# Patient Record
Sex: Female | Born: 1980 | Race: White | Hispanic: No | Marital: Married
Health system: Midwestern US, Community
[De-identification: ages and names within clinical notes are randomized; demographics above are authoritative.]

## PROBLEM LIST (undated history)

## (undated) DIAGNOSIS — C4491 Basal cell carcinoma of skin, unspecified: Secondary | ICD-10-CM

## (undated) DIAGNOSIS — F419 Anxiety disorder, unspecified: Secondary | ICD-10-CM

## (undated) DIAGNOSIS — K644 Residual hemorrhoidal skin tags: Secondary | ICD-10-CM

## (undated) DIAGNOSIS — T7840XA Allergy, unspecified, initial encounter: Secondary | ICD-10-CM

## (undated) DIAGNOSIS — R928 Other abnormal and inconclusive findings on diagnostic imaging of breast: Secondary | ICD-10-CM

## (undated) DIAGNOSIS — Z1231 Encounter for screening mammogram for malignant neoplasm of breast: Secondary | ICD-10-CM

## (undated) DIAGNOSIS — Z0000 Encounter for general adult medical examination without abnormal findings: Secondary | ICD-10-CM

## (undated) DIAGNOSIS — Z8619 Personal history of other infectious and parasitic diseases: Secondary | ICD-10-CM

## (undated) DIAGNOSIS — R921 Mammographic calcification found on diagnostic imaging of breast: Secondary | ICD-10-CM

## (undated) DIAGNOSIS — N6310 Unspecified lump in the right breast, unspecified quadrant: Secondary | ICD-10-CM

## (undated) DIAGNOSIS — M25851 Other specified joint disorders, right hip: Secondary | ICD-10-CM

## (undated) DIAGNOSIS — R109 Unspecified abdominal pain: Secondary | ICD-10-CM

## (undated) HISTORY — DX: Basal cell carcinoma of skin, unspecified: C44.91

## (undated) HISTORY — DX: Residual hemorrhoidal skin tags: K64.4

## (undated) HISTORY — DX: Allergy, unspecified, initial encounter: T78.40XA

---

## 2008-04-19 HISTORY — PX: OTHER SURGICAL HISTORY: SHX169

## 2008-07-29 ENCOUNTER — Ambulatory Visit: Payer: Self-pay | Admitting: Internal Medicine

## 2008-07-29 DIAGNOSIS — G43909 Migraine, unspecified, not intractable, without status migrainosus: Secondary | ICD-10-CM | POA: Insufficient documentation

## 2008-07-29 DIAGNOSIS — K602 Anal fissure, unspecified: Secondary | ICD-10-CM | POA: Insufficient documentation

## 2010-06-18 LAB — HM PAP SMEAR

## 2011-03-15 ENCOUNTER — Encounter: Payer: Self-pay | Admitting: Internal Medicine

## 2011-03-15 ENCOUNTER — Ambulatory Visit (INDEPENDENT_AMBULATORY_CARE_PROVIDER_SITE_OTHER): Payer: BC Managed Care – PPO | Admitting: Internal Medicine

## 2011-03-15 VITALS — BP 110/70 | HR 60 | Ht 62.0 in | Wt 110.0 lb

## 2011-03-15 DIAGNOSIS — R002 Palpitations: Secondary | ICD-10-CM

## 2011-03-15 DIAGNOSIS — J309 Allergic rhinitis, unspecified: Secondary | ICD-10-CM | POA: Insufficient documentation

## 2011-03-15 DIAGNOSIS — F411 Generalized anxiety disorder: Secondary | ICD-10-CM

## 2011-03-15 DIAGNOSIS — J069 Acute upper respiratory infection, unspecified: Secondary | ICD-10-CM | POA: Insufficient documentation

## 2011-03-15 DIAGNOSIS — F419 Anxiety disorder, unspecified: Secondary | ICD-10-CM | POA: Insufficient documentation

## 2011-03-15 LAB — CBC WITH DIFFERENTIAL/PLATELET
Basophils Relative: 0.5 % (ref 0.0–3.0)
Eosinophils Relative: 4.2 % (ref 0.0–5.0)
HCT: 40.9 % (ref 36.0–46.0)
MCV: 96.5 fl (ref 78.0–100.0)
Monocytes Absolute: 0.5 10*3/uL (ref 0.1–1.0)
Neutrophils Relative %: 77.6 % — ABNORMAL HIGH (ref 43.0–77.0)
RBC: 4.24 Mil/uL (ref 3.87–5.11)
WBC: 7.8 10*3/uL (ref 4.5–10.5)

## 2011-03-15 LAB — T4, FREE: Free T4: 0.95 ng/dL (ref 0.60–1.60)

## 2011-03-15 LAB — TSH: TSH: 2.11 u[IU]/mL (ref 0.35–5.50)

## 2011-03-15 LAB — BASIC METABOLIC PANEL
Chloride: 106 mEq/L (ref 96–112)
Creatinine, Ser: 0.6 mg/dL (ref 0.4–1.2)
Potassium: 4.4 mEq/L (ref 3.5–5.1)
Sodium: 142 mEq/L (ref 135–145)

## 2011-03-15 LAB — LIPID PANEL
Total CHOL/HDL Ratio: 2
Triglycerides: 81 mg/dL (ref 0.0–149.0)

## 2011-03-15 LAB — HEPATIC FUNCTION PANEL
ALT: 16 U/L (ref 0–35)
Alkaline Phosphatase: 49 U/L (ref 39–117)
Bilirubin, Direct: 0.1 mg/dL (ref 0.0–0.3)
Total Protein: 7.4 g/dL (ref 6.0–8.3)

## 2011-03-15 MED ORDER — ALPRAZOLAM 0.25 MG PO TABS: ORAL_TABLET | ORAL | Status: DC

## 2011-03-15 NOTE — Progress Notes (Signed)
Subjective:    Patient ID: [name] [name], female    DOB: 21-Aug-1980, 30 y.o.   MRN: [id number]  HPI Patient comes in as new patient visit . Previous care was  With her specialists: Sees OB [facility] DR fogelman and Dr Barnetta Chapel allergy.Has dx of  Allergies  with some asthmatic component  Given inhalers and no difference in her SOB  .    ? If anxiety after breathing tests done.  Per  Dr Barnetta Chapel.  Has had  Evaluation  at urgent care .   Nasal steroid flonase  Feels like going to get a cold.   Prev on  allergy shots.    PF meter  Noted  No change .   Given xanax as needed over a year ago and has same bottle.    Urgent care [address]   ? Name  Of such.  Things that many or her sx could be anxiety .  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  No panic in over a year .  Daily  Anxiety   Tends to worry a lot and builds up.    Tends to be a perfectionist.   From [place]  In [place] for 8 years  Some family here .  Review of Systems ROS:  GEN/ HEENTNo fever, significant weight changes sweats headaches vision problems hearing changes, CV/ PULM; No chest pain cough, syncope,edema  change in exercise tolerance. ocass  skippd beat or turning over.  Palpitation.  GI /GU: No adominal pain, vomiting, change in bowel habits. No blood in the stool. No significant GU symptoms. Periods are normal   No ocps  Off for 5 months. Trying for conceptions SKIN/HEME: ,no acute skin rashes suspicious lesions or bleeding. No lymphadenopathy, nodules, masses.  NEURO/ PSYCH:  No neurologic signs such as weakness numbness No depression IMM/ Allergy: No unusual infections.  Allergy .   REST of 12 system review negative  No active migraines   Past Medical History  Diagnosis Date  . Allergy   . Migraine   . Skin tag of anus     bleeding with them  . Skin cancer, basal cell     lower left back    History   Social History  . Marital Status: Married    Spouse Name: N/A    Number of Children: N/A  . Years of  Education: N/A   Occupational History  . Not on file.   Social History Main Topics  . Smoking status: Never Smoker   . Smokeless tobacco: Not on file  . Alcohol Use: Yes  . Drug Use: No  . Sexually Active: Not on file   Other Topics Concern  . 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 Basal cell skin cancer removed 2010    lower left back    Family History  Problem Relation Age of Onset  . Hypertension Mother   . Diabetes Mother   . Heart disease Father     suddent death ?  about age 44   . Healthy Sister   . Hypertension Brother   . Healthy Sister   . Alcohol abuse      MGF  . Breast cancer  GPs  . Lung cancer      gp     Allergies  Allergen Reactions  . Moxifloxacin     REACTION: difficulty breathing    No current outpatient prescriptions on file prior to visit.    BP 110/70  Pulse 60  Ht 5\' 2"  (1.575 m)  Wt 110 lb (49.896 kg)  BMI 20.12 kg/m2  LMP 03/13/2011        Objective:   Physical Exam WDWN in NAD  quiet respirations; mildly congested  somewhat hoarse. Non toxic . HEENT: Normocephalic ;atraumatic , Eyes;  PERRL, EOMs  Full, lids and conjunctiva clear,,Ears: no deformities, canals nl, TM landmarks normal, Nose: no deformity or discharge but congested;face minimally tender Mouth : OP clear without lesion or edema . Neck: Supple without adenopathy or masses or bruits Chest:  Clear to A&P without wheezes rales or rhonchi CV:  S1-S2 no gallops or murmurs peripheral perfusion is normal Skin :nl perfusion and no acute rashes  NEURO: oriented x 3 CN 3-12 appear intact. No focal muscle weakness or atrophy. DTRs symmetrical. Gait WNL.  Grossly non focal. No tremor or abnormal movement. Oriented x 3. Normal cognition, attention,  [email or web address] anxious  Good eye contact .       Assessment & Plan:  Allergic rhinitis can be problematic   Problem with given nasal steroid Sob episodes  Poss from anxiety URI /.viral should resolve  Anxiety  ? On hand .  Med with help and little use.   Risk benefit of medication discussed.  Disc etoh intake is counter productive  Sometimes uses 5 bev at a time .  fam hs MGF etoh  Counseled.   At length   Labs to be done to r/o metabolic contributor as this was not yet done . Suspect to be normal follow up depending on results.

## 2011-03-15 NOTE — Patient Instructions (Signed)
Will notify you  of labs when available. Consider changing type of nasal cortisone speak with dr Barnetta Chapel for that. Limit alcohol as we discussed as well as caffeine. Do not take xanax with alcohol.  Disc with OB [facility] if need controller med for anxiety which choices may be best.

## 2011-03-18 ENCOUNTER — Encounter: Payer: Self-pay | Admitting: *Deleted

## 2011-03-18 NOTE — Progress Notes (Signed)
Quick Note:  Mailed letter to pt ______ 

## 2011-04-01 ENCOUNTER — Encounter: Payer: Self-pay | Admitting: Internal Medicine

## 2011-05-05 LAB — OB RESULTS CONSOLE HIV ANTIBODY (ROUTINE TESTING): HIV: NONREACTIVE

## 2011-05-05 LAB — OB RESULTS CONSOLE HEPATITIS B SURFACE ANTIGEN: Hepatitis B Surface Ag: NEGATIVE

## 2011-05-05 LAB — OB RESULTS CONSOLE ABO/RH: RH Type: POSITIVE

## 2011-05-05 LAB — OB RESULTS CONSOLE RUBELLA ANTIBODY, IGM: Rubella: IMMUNE

## 2011-05-27 ENCOUNTER — Other Ambulatory Visit: Payer: Self-pay

## 2011-06-04 LAB — OB RESULTS CONSOLE GC/CHLAMYDIA: Chlamydia: NEGATIVE

## 2011-06-07 ENCOUNTER — Other Ambulatory Visit (HOSPITAL_COMMUNITY): Payer: Self-pay | Admitting: Obstetrics

## 2011-06-07 DIAGNOSIS — Z0489 Encounter for examination and observation for other specified reasons: Secondary | ICD-10-CM

## 2011-06-30 ENCOUNTER — Encounter (HOSPITAL_COMMUNITY): Payer: Self-pay | Admitting: MS"

## 2011-07-16 ENCOUNTER — Ambulatory Visit (HOSPITAL_COMMUNITY)
Admission: RE | Admit: 2011-07-16 | Discharge: 2011-07-16 | Disposition: A | Discharge status: Home / Self Care | Payer: BC Managed Care – PPO | Source: Ambulatory Visit | Attending: Obstetrics | Admitting: Obstetrics

## 2011-07-16 ENCOUNTER — Encounter (HOSPITAL_COMMUNITY): Payer: Self-pay

## 2011-07-16 ENCOUNTER — Other Ambulatory Visit: Payer: Self-pay

## 2011-07-16 DIAGNOSIS — Z0489 Encounter for examination and observation for other specified reasons: Secondary | ICD-10-CM

## 2011-07-16 DIAGNOSIS — O352XX0 Maternal care for (suspected) hereditary disease in fetus, not applicable or unspecified: Secondary | ICD-10-CM | POA: Insufficient documentation

## 2011-07-16 DIAGNOSIS — O358XX0 Maternal care for other (suspected) fetal abnormality and damage, not applicable or unspecified: Secondary | ICD-10-CM | POA: Insufficient documentation

## 2011-07-16 DIAGNOSIS — Z1389 Encounter for screening for other disorder: Secondary | ICD-10-CM | POA: Insufficient documentation

## 2011-07-16 DIAGNOSIS — Z363 Encounter for antenatal screening for malformations: Secondary | ICD-10-CM | POA: Insufficient documentation

## 2011-07-16 HISTORY — DX: Anxiety disorder, unspecified: F41.9

## 2011-07-16 NOTE — Progress Notes (Signed)
Genetic Counseling  High-Risk Gestation Note  Appointment Date:  07/16/2011 Referred By: [name] [name]., MD Date of Birth:  02-13-1981 Partner: [name] [name]    Pregnancy History: G1P0000 Estimated Date of Delivery: 12/15/11 Estimated Gestational Age: [redacted]w[redacted]d Attending: Rica Koyanagi, MD   [name] [name] and her husband, [name] [name], were seen for genetic counseling because of a family history of DiGeorge syndrome. They were also accompanied by the patient's mother to today's visit.   Both family histories were reviewed and found to be contributory for the patient's nephew born with DiGeorge syndrome. He had tetralogy of fallot and passed away at age 64 months. Reportedly his parents (the patient's sister and her husband) had normal testing for DiGeorge syndrome in [place], [place]. We do not currently have medical documentation confirming this information.   DiGeorge syndrome, also called 22q11.2 deletion syndrome or Velocardiofacial syndrome is caused by the deletion of genes on one end of chromosome 22 (the q11.2 region of the chromosome). Common features of this condition include heart defects, cleft palate, characteristic facial features, immune deficiency, and learning disabilities. Less commonly, individuals with DiGeorge syndrome may also have an autoimmune disease, growth hormone deficiency, hearing loss, and psychiatric illness. Symptoms may vary significantly from person to person, even among relatives.    Approximately 93% of individuals with DiGeorge syndrome have a de novo (occuring for the first time in that individual) deletion of the 22q11.2 region, and approximately 7% of individuals inherited the deletion from one parent who also carries the deletion of the 22q11.2 region. DiGeorge syndrome is an autosomal dominant condition, meaning once a person has the condition they have a 50% (1 in 2) chance with each pregnancy to pass on the chromosome 22 that has the  deletion and also have a child with the condition. Targeted ultrasound may detect features of 22q11.2 deletion syndrome, though features are variable, even within families. Ultrasound would not diagnose or rule out this condition. We discussed that prenatal testing for 22q11.2 deletion syndrome could  be done on cultured amniocytes using FISH (Fluorescent in-situ hybridization) to detect a deletion of 22q11.2, as this deletion is not typically detected on routine chromosome analysis. However, given the reported family history and the reported normal testing for [name] [name]'s sister (indicating that her sister does not carry the 22q11.2 deletion), recurrence risk for DiGeorge syndrome in the current pregnancy is not expected to be increased above the general population risk.   Additionally, the patient reported a maternal first cousin once removed with Down syndrome (unspecified type). We discussed that 95% of cases of Down syndrome are not inherited and are the result of non-disjunction.  Three to 4% of cases of Down syndrome are the result of a translocation involving chromosome #21.  We discussed the option of chromosome analysis to determine if an individual is a carrier of a balanced translocation involving chromosome #21.  If an individual carries a balanced translocation involving chromosome #21, then the chance to have a baby with Down syndrome would be greater than the maternal age-related risk.  Given the reported family history, this relative's Down syndrome was most likely sporadic. However, additional information regarding this relative's karyotype, or the karyotype of more closely related relatives may alter recurrence risk assessment.    The father of the pregnancy reported that he has bicuspid aortic valve and supraventricular tachycardia, diagnosed within the past 5 years. He has surgery scheduled for SVT in June, and he reported that he does not require surgical correction of  the bicuspid  aortic valve at this time. Additionally, his brother has Wolf-Parkinson White syndrome. Congenital heart defects occur in approximately 1% of pregnancies.  Congenital heart defects may occur due to multifactorial influences, chromosomal abnormalities, genetic syndromes or environmental exposures.  Isolated heart defects are generally multifactorial.  Given the reported family history and assuming multifactorial inheritance, the risk for a congenital heart defect in the current pregnancy may be increased to approximately 2-3%. The couple stated that a fetal echocardiogram is scheduled in April with [facility]. We discussed that it may be helpful for them to inform their pediatrician regarding the family history of heart arrythmia so that their child(ren) can be screened and followed appropriately. Without further information regarding the provided family history, an accurate genetic risk cannot be calculated. Further genetic counseling is warranted if more information is obtained.  Targeted ultrasound was performed at the time of today's visit. An echogenic intracardiac focus (EIF) and choroid plexus cysts (CPCs) were visualized at this time. Complete ultrasound results reported separately. An isolated echogenic focus is generally believed to be a normal variation without any concerns for the pregnancy.  Isolated echogenic cardiac foci are not associated with congenital heart defects in the baby or compromised cardiac function after birth.  However, an echogenic cardiac focus is associated with a slightly increased chance for Down syndrome in the pregnancy in pregnancies with additional risk factors for aneuploidy. The choroid plexus is an area in the brain where cerebral spinal fluid, the fluid that bathes the brain and spinal cord, is made.  Cysts, or fluid filled sacs, are sometimes found in the choroid plexus of babies both before and after they are born.  Approximately 1% of pregnancies  evaluated by ultrasound will show these cysts.  The significance of these cysts remains unclear, although it is believed that in many cases they are a normal variation of development.  It appears that there may be a slightly increased risk of a chromosome condition, specifically Trisomy 18, associated with these cysts when they are seen with other ultrasound findings or other risk factors for fetal aneuploidy.  They were counseled regarding the normal First trimester screen result and the associated decrease in risks for risk for fetal Down syndrome (1 in 9,021) and trisomy 18/13 (less than 1 in 10,000). We discussed that given this screen negative First trimester screen, the findings of EIF and CPCs typically would not be associated with an increased risk for aneuploidy. However, we discussed that even in the case that these ultrasound findings were associated with an increased risk above the screen result, the adjusted risk would be lower than the patient's a priori age related risk for these conditions.  We reviewed chromosomes, nondisjunction, and the common features and variable prognosis of Down syndrome as well as the features and poor prognosis of trisomy 61.  We discussed another type of screening test, noninvasive prenatal testing (NIPT), which utilizes cell free fetal DNA found in the maternal circulation. This test is not diagnostic for chromosome conditions, but can provide information regarding the presence or absence of extra fetal DNA for chromosomes 13, 18 and 21. Thus, it would not identify or rule out all fetal aneuploidy. The reported detection rate is greater than 99% for Trisomy 21, greater than 97% for Trisomy 18, and is approximately 80% (8 out of 10) for Trisomy 13. The false positive rate is reported to be less than 1% for any of these conditions. We reviewed the diagnostic option of amniocentesis.  We discussed the risks, limitations, and benefits of each. However, we reviewed that the  risk for fetal aneuploidy, given the available information, is lower than the risk of complications from amniocentesis.  After consideration of all the options, they elected to proceed with cell free fetal DNA testing ([facility]) at the time of today's visit, but declined amniocentesis. Those results will be available in 8-10 days and will be forwarded to her OB office when we receive them.  They understand that ultrasound cannot rule out all birth defects or genetic syndromes.    [name] [name] denied exposure to environmental toxins or chemical agents. She denied the use of tobacco or street drugs. She reported drinking alcohol on Christmas day, prior to being aware of the pregnancy. The all-or-none period was discussed, meaning exposures that occur in the first 4 weeks of gestation are typically thought to either not affect the pregnancy at all or result in a miscarriage. She reported no alcohol exposure since that time. She denied significant viral illnesses during the course of her pregnancy. Her medical and surgical histories were noncontributory.   I counseled this couple for approximately 40 minutes regarding the above risks and available options.     Quinn Plowman, MS,  Certified Genetic Counselor 07/16/2011

## 2011-07-16 NOTE — Progress Notes (Signed)
Obstetric ultrasound performed today.   Bilateral choroid plexus cysts and an echogenic intracardiac foci are seen today.  Ultrasound findings were discussed and the patient received genetic counseling (see separate report).  She elected to proceed with maternal serum evaluation of fetal cell free fetal DNA.  This was drawn in our office today.    Fetal echo was also recommended.  Patient states that she has this scheduled.      Repeat ultrasound scheduled in 6-8 weeks to re evaluate fetal anatomy.  Please see full report in [facility]

## 2011-07-26 ENCOUNTER — Telehealth (HOSPITAL_COMMUNITY): Payer: Self-pay | Admitting: MS"

## 2011-07-26 NOTE — Telephone Encounter (Signed)
Left message for patient to return call.

## 2011-07-26 NOTE — Telephone Encounter (Signed)
Called [name] [name] to discuss her [facility], cell free fetal DNA testing.  We reviewed that these are within normal limits, showing a less than 1 in 10,000 risk for trisomies 21, 18 and 13.  We reviewed that this testing identifies > 99% of pregnancies with trisomy 21, >97% of pregnancies with trisomy 91, and >80% with trisomy 66; the false positive rate is <0.1% for all conditions.  She understands that this testing does not identify all genetic conditions.  All questions were answered to her satisfaction, she was encouraged to call with additional questions or concerns.  Quinn Plowman, MS Patent attorney 3:33 PM

## 2011-08-26 ENCOUNTER — Other Ambulatory Visit (HOSPITAL_COMMUNITY): Payer: Self-pay | Admitting: Obstetrics

## 2011-08-26 DIAGNOSIS — O352XX0 Maternal care for (suspected) hereditary disease in fetus, not applicable or unspecified: Secondary | ICD-10-CM

## 2011-08-27 ENCOUNTER — Ambulatory Visit (HOSPITAL_COMMUNITY)
Admission: RE | Admit: 2011-08-27 | Discharge: 2011-08-27 | Disposition: A | Discharge status: Home / Self Care | Payer: BC Managed Care – PPO | Source: Ambulatory Visit | Attending: Obstetrics | Admitting: Obstetrics

## 2011-08-27 VITALS — BP 105/68 | HR 112 | Wt 122.5 lb

## 2011-08-27 DIAGNOSIS — O350XX0 Maternal care for (suspected) central nervous system malformation in fetus, not applicable or unspecified: Secondary | ICD-10-CM | POA: Insufficient documentation

## 2011-08-27 DIAGNOSIS — O3500X0 Maternal care for (suspected) central nervous system malformation or damage in fetus, unspecified, not applicable or unspecified: Secondary | ICD-10-CM | POA: Insufficient documentation

## 2011-08-27 DIAGNOSIS — O358XX0 Maternal care for other (suspected) fetal abnormality and damage, not applicable or unspecified: Secondary | ICD-10-CM | POA: Insufficient documentation

## 2011-08-27 DIAGNOSIS — O352XX0 Maternal care for (suspected) hereditary disease in fetus, not applicable or unspecified: Secondary | ICD-10-CM | POA: Insufficient documentation

## 2011-08-27 NOTE — Progress Notes (Signed)
Patient seen today  for follow up ultrasound.  See full report in [facility].  Alpha Gula, MD  IUP at 24 weeks 2 day Fetal measurements consistent with dating by LMP Normal amniotic fluid volume Interval growth is appropriate. Echogenic intracardiac focus is again noted.  Choroid plexus cysts not seen. Otherwise normal anatomic survey. Normal cell free fetal DNA testing.  Recommend follow up as clinically indicated

## 2011-10-04 LAB — OB RESULTS CONSOLE RPR: RPR: NONREACTIVE

## 2011-12-16 ENCOUNTER — Encounter (HOSPITAL_COMMUNITY): Payer: Self-pay | Admitting: *Deleted

## 2011-12-16 ENCOUNTER — Inpatient Hospital Stay (HOSPITAL_COMMUNITY)
Admission: AD | Admit: 2011-12-16 | Discharge: 2011-12-19 | DRG: 370 | Disposition: A | Discharge status: Home / Self Care | Payer: BC Managed Care – PPO | Source: Ambulatory Visit | Attending: Obstetrics and Gynecology | Admitting: Obstetrics and Gynecology

## 2011-12-16 DIAGNOSIS — Z22330 Carrier of Group B streptococcus: Secondary | ICD-10-CM

## 2011-12-16 DIAGNOSIS — D62 Acute posthemorrhagic anemia: Secondary | ICD-10-CM | POA: Diagnosis not present

## 2011-12-16 DIAGNOSIS — O9903 Anemia complicating the puerperium: Secondary | ICD-10-CM | POA: Diagnosis not present

## 2011-12-16 DIAGNOSIS — O4290 Premature rupture of membranes, unspecified as to length of time between rupture and onset of labor, unspecified weeks of gestation: Secondary | ICD-10-CM | POA: Diagnosis present

## 2011-12-16 DIAGNOSIS — O324XX0 Maternal care for high head at term, not applicable or unspecified: Secondary | ICD-10-CM | POA: Diagnosis present

## 2011-12-16 DIAGNOSIS — O99892 Other specified diseases and conditions complicating childbirth: Secondary | ICD-10-CM | POA: Diagnosis present

## 2011-12-16 LAB — CBC
HCT: 38.8 % (ref 36.0–46.0)
Hemoglobin: 13.5 g/dL (ref 12.0–15.0)
MCH: 33.4 pg (ref 26.0–34.0)
MCHC: 34.8 g/dL (ref 30.0–36.0)
MCV: 96 fL (ref 78.0–100.0)
Platelets: 201 10*3/uL (ref 150–400)
RBC: 4.04 MIL/uL (ref 3.87–5.11)
RDW: 13.1 % (ref 11.5–15.5)
WBC: 10.6 10*3/uL — ABNORMAL HIGH (ref 4.0–10.5)

## 2011-12-16 MED ORDER — PENICILLIN G POTASSIUM 5000000 UNITS IJ SOLR
2.5000 10*6.[IU] | INTRAVENOUS | Status: DC
  Administered 2011-12-16 – 2011-12-17 (×3): 2.5 10*6.[IU] via INTRAVENOUS
  Filled 2011-12-16 (×7): qty 2.5

## 2011-12-16 MED ORDER — SODIUM CHLORIDE 0.9 % IJ SOLN: 3.0000 mL | Freq: Two times a day (BID) | INTRAMUSCULAR | Status: DC

## 2011-12-16 MED ORDER — SODIUM CHLORIDE 0.9 % IV SOLN: 250.0000 mL | INTRAVENOUS | Status: DC | PRN

## 2011-12-16 MED ORDER — NALBUPHINE SYRINGE 5 MG/0.5 ML
5.0000 mg | INJECTION | INTRAMUSCULAR | Status: DC | PRN
  Administered 2011-12-17: 5 mg via INTRAVENOUS
  Filled 2011-12-16 (×3): qty 0.5

## 2011-12-16 MED ORDER — SODIUM CHLORIDE 0.9 % IJ SOLN: 3.0000 mL | INTRAMUSCULAR | Status: DC | PRN

## 2011-12-16 MED ORDER — OXYTOCIN BOLUS FROM INFUSION
250.0000 mL | INTRAVENOUS | Status: DC | PRN
  Filled 2011-12-16: qty 500

## 2011-12-16 MED ORDER — OXYTOCIN 40 UNITS IN LACTATED RINGERS INFUSION - SIMPLE MED
1.0000 m[IU]/min | INTRAVENOUS | Status: DC
  Administered 2011-12-16: 2 m[IU]/min via INTRAVENOUS
  Filled 2011-12-16: qty 1000

## 2011-12-16 MED ORDER — ACETAMINOPHEN 325 MG PO TABS: 650.0000 mg | ORAL_TABLET | ORAL | Status: DC | PRN

## 2011-12-16 MED ORDER — CITRIC ACID-SODIUM CITRATE 334-500 MG/5ML PO SOLN
30.0000 mL | ORAL | Status: DC | PRN
  Administered 2011-12-17: 30 mL via ORAL
  Filled 2011-12-16: qty 15

## 2011-12-16 MED ORDER — OXYTOCIN 40 UNITS IN LACTATED RINGERS INFUSION - SIMPLE MED: 62.5000 mL/h | INTRAVENOUS | Status: DC | PRN

## 2011-12-16 MED ORDER — LIDOCAINE HCL (PF) 1 % IJ SOLN
30.0000 mL | INTRAMUSCULAR | Status: DC | PRN
  Filled 2011-12-16: qty 30

## 2011-12-16 MED ORDER — IBUPROFEN 600 MG PO TABS: 600.0000 mg | ORAL_TABLET | Freq: Four times a day (QID) | ORAL | Status: DC | PRN

## 2011-12-16 MED ORDER — PENICILLIN G POTASSIUM 5000000 UNITS IJ SOLR
5.0000 10*6.[IU] | Freq: Once | INTRAMUSCULAR | Status: AC
  Administered 2011-12-16: 5 10*6.[IU] via INTRAVENOUS
  Filled 2011-12-16: qty 5

## 2011-12-16 MED ORDER — LACTATED RINGERS IV SOLN
500.0000 mL | INTRAVENOUS | Status: DC | PRN
  Administered 2011-12-16: 1000 mL via INTRAVENOUS
  Administered 2011-12-17: 250 mL via INTRAVENOUS
  Administered 2011-12-17: 1000 mL via INTRAVENOUS

## 2011-12-16 NOTE — Progress Notes (Signed)
S: Feeling strong ctx      tolerating contractions by breathing with ctx / labor support by spouse and sister      tried shower with minimal relief - filled tub and laboring in tub now   O:  VS: Blood pressure 122/74, pulse 82, temperature 98.1 F (36.7 C), temperature source Oral, resp. rate 18, height 5\' 2"  (1.575 m), weight 132 lb (59.875 kg), last menstrual period 03/10/2011, unknown if currently breastfeeding.        FHR : baseline 135 / variability moderate / accels + / decels -occasional mild variable        Toco: contractions every 2-3 minutes / moderate / pitocin at 4 mu/min        Cervix : defer exam at this time        Membranes: clear fluid reported by pt   A: active labor     FHR category 1  P: effective pitocin augmentation       labor support as needed      expectant management - recheck cervical progression in next 2 hours  [name] [name] CNM, MSN 12/16/2011, 9:11 PM

## 2011-12-16 NOTE — Progress Notes (Signed)
S: Feeling intense ctx      Tolerating contractions well - breathing with ctx / declines any analgesia      Activity ad lib - in and out of tub / shower  O:  VS: Blood pressure 117/79, pulse 101, temperature 98.1 F (36.7 C), temperature source Oral, resp. rate 18, height 5\' 2"  (1.575 m), weight 132 lb (59.875 kg), last menstrual period 03/10/2011, unknown if currently breastfeeding.        FHR : baseline 145   / variability moderate / accels + / decels none        Toco: contractions every 3 minutes / pitocin 4 mu/min        Cervix : 5 / 90% / vtx -1        Membranes: clear fluid with show  A: active labor     FHR category 1  P: continue current management  Marlinda Mike CNM, MSN 12/16/2011, 10:37 PM

## 2011-12-16 NOTE — Progress Notes (Signed)
S: Feeling stronger ctx and more uncomfortable     Tolerating contractions well - ambulating ad lib       O:  VS: Blood pressure 113/71, pulse 91, temperature 97.9 F (36.6 C), temperature source Oral, resp. rate 18, height 5\' 2"  (1.575 m), weight 132 lb (59.875 kg), last menstrual period 03/10/2011.        FHR : baseline 140 / variability moderate / accels + / decels none        Toco: contractions every 4-5 minutes / mild         Cervix : same at 3cm / 80% / vtx / -1        Membranes: clear fluid / pink   A: Latent labor with progression to active labor     FHR category 1     GBS (+) - dose # 2 PCN     Bradley preparation for labor & birth  P: discussion with patient about ROM with GBS and no spontaneous progression into active labor at 11 hours ROM      risk for GBS sepsis with prolonged ROM       recommendation for pitocin augmentation at this time        patient and spouse declines pitocin - wants "to avoid pitocin at all costs" state they would rather wait until signs of infection - discussed with signs of infection and remote from delivery more likely to deliver by CS and risk for NICU admission for newborn spouse requests other options:        nipple stimulation will not produce regular ctx pattern to achieve active labor         cervical balloon will not achieve any further labor progression since cervix already dilated 3-4 cm        Cytotec not given with dilated cervix to 3cm and SROM        best and only option at that time with current status - pitocin augmentation  Request an hour to consider pitocin Dr Ernestina Penna (Primary OB provider) - updated with status and patient declining pitocin   Marlinda Mike CNM, MSN 12/16/2011, 5:19 PM

## 2011-12-16 NOTE — Progress Notes (Signed)
S: feeling same     tolerating contractions well      Pt agrees to augmentation after consideration and discussion with family       O:  VS: Blood pressure 105/66, pulse 103, temperature 98.5 F (36.9 C), temperature source Oral, resp. rate 18, height 5\' 2"  (1.575 m), weight 132 lb (59.875 kg), last menstrual period 03/10/2011, unknown if currently breastfeeding.        FHR : baseline 135 / variability moderate / accels + / decels none        Toco: contractions every 2-5 minutes / pitocin at 28mu/min         Cervix : defer recheck         Membranes: clear fluid  A: PROM at term      FHR category 1  P: pitocin augmentation    Marlinda Mike CNM, MSN 12/16/2011, 7:09 PM

## 2011-12-16 NOTE — Progress Notes (Signed)
Met w/ pt to review history, progress to date and plan  G1 at term w/ ROM 6 am today. Pt known GBS positive. Has been managing expectantly all day. Pt feels mild contractions, has been up walking, good FM, continues to leak fluid, no fevers.   A/P: prolonged ROM, not yet in active labor, GBS, need for pitocin augmentation.  - Pt hesitant about pitocin as this was not in her birth plan. We discussed risks of prolonged ROM, esp in the setting of GBS positivity. We discussed risks of chorio, neonatal infection, hemorrhage, inadequate labor with chorio, risk of c/s. I d/w pt my recommendation to proceed with pitocin induction by 6p, thus having given her 12 hr to enter labor.   - Pt would like time to think, but seems to be agreeable.  Evaan Tidwell A. 12/16/2011 9:37 PM

## 2011-12-17 ENCOUNTER — Encounter (HOSPITAL_COMMUNITY): Payer: Self-pay | Admitting: *Deleted

## 2011-12-17 ENCOUNTER — Inpatient Hospital Stay (HOSPITAL_COMMUNITY): Payer: BC Managed Care – PPO | Admitting: Anesthesiology

## 2011-12-17 ENCOUNTER — Encounter (HOSPITAL_COMMUNITY): Payer: Self-pay | Admitting: Anesthesiology

## 2011-12-17 ENCOUNTER — Encounter (HOSPITAL_COMMUNITY)
Admission: AD | Disposition: A | Discharge status: Home / Self Care | Payer: Self-pay | Source: Ambulatory Visit | Attending: Obstetrics and Gynecology

## 2011-12-17 LAB — RPR: RPR Ser Ql: NONREACTIVE

## 2011-12-17 SURGERY — Surgical Case
Anesthesia: Spinal | Site: Abdomen | Wound class: Clean Contaminated

## 2011-12-17 MED ORDER — NALBUPHINE HCL 10 MG/ML IJ SOLN
5.0000 mg | INTRAMUSCULAR | Status: DC | PRN
  Filled 2011-12-17: qty 1

## 2011-12-17 MED ORDER — OXYTOCIN 40 UNITS IN LACTATED RINGERS INFUSION - SIMPLE MED: 62.5000 mL/h | INTRAVENOUS | Status: AC

## 2011-12-17 MED ORDER — PHENYLEPHRINE 40 MCG/ML (10ML) SYRINGE FOR IV PUSH (FOR BLOOD PRESSURE SUPPORT)
PREFILLED_SYRINGE | INTRAVENOUS | Status: AC
  Filled 2011-12-17: qty 5

## 2011-12-17 MED ORDER — SCOPOLAMINE 1 MG/3DAYS TD PT72
MEDICATED_PATCH | TRANSDERMAL | Status: AC
  Administered 2011-12-17: 1.5 mg via TRANSDERMAL
  Filled 2011-12-17: qty 1

## 2011-12-17 MED ORDER — IBUPROFEN 600 MG PO TABS: 600.0000 mg | ORAL_TABLET | Freq: Four times a day (QID) | ORAL | Status: DC | PRN

## 2011-12-17 MED ORDER — SENNOSIDES-DOCUSATE SODIUM 8.6-50 MG PO TABS
2.0000 | ORAL_TABLET | Freq: Every day | ORAL | Status: DC
  Administered 2011-12-17 – 2011-12-19 (×2): 2 via ORAL

## 2011-12-17 MED ORDER — MEPERIDINE HCL 25 MG/ML IJ SOLN: 6.2500 mg | INTRAMUSCULAR | Status: DC | PRN

## 2011-12-17 MED ORDER — SIMETHICONE 80 MG PO CHEW
80.0000 mg | CHEWABLE_TABLET | Freq: Three times a day (TID) | ORAL | Status: DC
  Administered 2011-12-17 – 2011-12-19 (×7): 80 mg via ORAL

## 2011-12-17 MED ORDER — DIBUCAINE 1 % RE OINT: 1.0000 "application " | TOPICAL_OINTMENT | RECTAL | Status: DC | PRN

## 2011-12-17 MED ORDER — ONDANSETRON HCL 4 MG PO TABS: 4.0000 mg | ORAL_TABLET | ORAL | Status: DC | PRN

## 2011-12-17 MED ORDER — ONDANSETRON HCL 4 MG/2ML IJ SOLN
INTRAMUSCULAR | Status: DC | PRN
  Administered 2011-12-17: 4 mg via INTRAVENOUS

## 2011-12-17 MED ORDER — FENTANYL CITRATE 0.05 MG/ML IJ SOLN
INTRAMUSCULAR | Status: AC
  Filled 2011-12-17: qty 2

## 2011-12-17 MED ORDER — MORPHINE SULFATE 0.5 MG/ML IJ SOLN
INTRAMUSCULAR | Status: AC
  Filled 2011-12-17: qty 10

## 2011-12-17 MED ORDER — TETANUS-DIPHTH-ACELL PERTUSSIS 5-2.5-18.5 LF-MCG/0.5 IM SUSP: 0.5000 mL | Freq: Once | INTRAMUSCULAR | Status: DC

## 2011-12-17 MED ORDER — CEFAZOLIN SODIUM-DEXTROSE 2-3 GM-% IV SOLR
2.0000 g | Freq: Once | INTRAVENOUS | Status: AC
  Administered 2011-12-17: 2 g via INTRAVENOUS
  Filled 2011-12-17: qty 50

## 2011-12-17 MED ORDER — PHENYLEPHRINE 40 MCG/ML (10ML) SYRINGE FOR IV PUSH (FOR BLOOD PRESSURE SUPPORT)
PREFILLED_SYRINGE | INTRAVENOUS | Status: AC
  Filled 2011-12-17: qty 10

## 2011-12-17 MED ORDER — PRENATAL MULTIVITAMIN CH
1.0000 | ORAL_TABLET | Freq: Every day | ORAL | Status: DC
  Administered 2011-12-18 – 2011-12-19 (×2): 1 via ORAL
  Filled 2011-12-17: qty 1

## 2011-12-17 MED ORDER — MORPHINE SULFATE (PF) 0.5 MG/ML IJ SOLN
INTRAMUSCULAR | Status: DC | PRN
  Administered 2011-12-17: .15 mg via INTRATHECAL

## 2011-12-17 MED ORDER — KETOROLAC TROMETHAMINE 30 MG/ML IJ SOLN
30.0000 mg | Freq: Four times a day (QID) | INTRAMUSCULAR | Status: AC | PRN
  Administered 2011-12-17: 30 mg via INTRAVENOUS
  Filled 2011-12-17: qty 1

## 2011-12-17 MED ORDER — IBUPROFEN 600 MG PO TABS
600.0000 mg | ORAL_TABLET | Freq: Four times a day (QID) | ORAL | Status: DC
  Administered 2011-12-18 – 2011-12-19 (×7): 600 mg via ORAL
  Filled 2011-12-17 (×7): qty 1

## 2011-12-17 MED ORDER — ONDANSETRON HCL 4 MG/2ML IJ SOLN
INTRAMUSCULAR | Status: AC
  Filled 2011-12-17: qty 2

## 2011-12-17 MED ORDER — DIPHENHYDRAMINE HCL 50 MG/ML IJ SOLN: 25.0000 mg | INTRAMUSCULAR | Status: DC | PRN

## 2011-12-17 MED ORDER — PHENYLEPHRINE HCL 10 MG/ML IJ SOLN
INTRAMUSCULAR | Status: DC | PRN
  Administered 2011-12-17 (×2): 40 ug via INTRAVENOUS
  Administered 2011-12-17: 80 ug via INTRAVENOUS
  Administered 2011-12-17: 40 ug via INTRAVENOUS
  Administered 2011-12-17: 80 ug via INTRAVENOUS

## 2011-12-17 MED ORDER — MENTHOL 3 MG MT LOZG: 1.0000 | LOZENGE | OROMUCOSAL | Status: DC | PRN

## 2011-12-17 MED ORDER — ONDANSETRON HCL 4 MG/2ML IJ SOLN: 4.0000 mg | INTRAMUSCULAR | Status: DC | PRN

## 2011-12-17 MED ORDER — FENTANYL CITRATE 0.05 MG/ML IJ SOLN
INTRAMUSCULAR | Status: DC | PRN
  Administered 2011-12-17: 25 ug via INTRATHECAL

## 2011-12-17 MED ORDER — DIPHENHYDRAMINE HCL 25 MG PO CAPS: 25.0000 mg | ORAL_CAPSULE | Freq: Four times a day (QID) | ORAL | Status: DC | PRN

## 2011-12-17 MED ORDER — OXYTOCIN 10 UNIT/ML IJ SOLN
40.0000 [IU] | INTRAVENOUS | Status: DC | PRN
  Administered 2011-12-17: 40 [IU] via INTRAVENOUS

## 2011-12-17 MED ORDER — DIPHENHYDRAMINE HCL 25 MG PO CAPS: 25.0000 mg | ORAL_CAPSULE | ORAL | Status: DC | PRN

## 2011-12-17 MED ORDER — SODIUM CHLORIDE 0.9 % IJ SOLN: 3.0000 mL | INTRAMUSCULAR | Status: DC | PRN

## 2011-12-17 MED ORDER — ZOLPIDEM TARTRATE 5 MG PO TABS: 5.0000 mg | ORAL_TABLET | Freq: Every evening | ORAL | Status: DC | PRN

## 2011-12-17 MED ORDER — SIMETHICONE 80 MG PO CHEW: 80.0000 mg | CHEWABLE_TABLET | ORAL | Status: DC | PRN

## 2011-12-17 MED ORDER — OXYCODONE-ACETAMINOPHEN 5-325 MG PO TABS: 1.0000 | ORAL_TABLET | ORAL | Status: DC | PRN

## 2011-12-17 MED ORDER — KETOROLAC TROMETHAMINE 30 MG/ML IJ SOLN: 30.0000 mg | Freq: Four times a day (QID) | INTRAMUSCULAR | Status: AC | PRN

## 2011-12-17 MED ORDER — DIPHENHYDRAMINE HCL 50 MG/ML IJ SOLN: 12.5000 mg | INTRAMUSCULAR | Status: DC | PRN

## 2011-12-17 MED ORDER — LACTATED RINGERS IV SOLN
INTRAVENOUS | Status: DC
  Administered 2011-12-17 (×2): via INTRAVENOUS

## 2011-12-17 MED ORDER — ONDANSETRON HCL 4 MG/2ML IJ SOLN: 4.0000 mg | Freq: Three times a day (TID) | INTRAMUSCULAR | Status: DC | PRN

## 2011-12-17 MED ORDER — SCOPOLAMINE 1 MG/3DAYS TD PT72
1.0000 | MEDICATED_PATCH | Freq: Once | TRANSDERMAL | Status: DC
  Administered 2011-12-17: 1.5 mg via TRANSDERMAL

## 2011-12-17 MED ORDER — FENTANYL CITRATE 0.05 MG/ML IJ SOLN: 25.0000 ug | INTRAMUSCULAR | Status: DC | PRN

## 2011-12-17 MED ORDER — NALOXONE HCL 0.4 MG/ML IJ SOLN: 0.4000 mg | INTRAMUSCULAR | Status: DC | PRN

## 2011-12-17 MED ORDER — WITCH HAZEL-GLYCERIN EX PADS: 1.0000 "application " | MEDICATED_PAD | CUTANEOUS | Status: DC | PRN

## 2011-12-17 MED ORDER — KETOROLAC TROMETHAMINE 60 MG/2ML IM SOLN
INTRAMUSCULAR | Status: AC
  Administered 2011-12-17: 60 mg
  Filled 2011-12-17: qty 2

## 2011-12-17 MED ORDER — SODIUM CHLORIDE 0.9 % IV SOLN: 1.0000 ug/kg/h | INTRAVENOUS | Status: DC | PRN

## 2011-12-17 MED ORDER — BUPIVACAINE IN DEXTROSE 0.75-8.25 % IT SOLN
INTRATHECAL | Status: DC | PRN
  Administered 2011-12-17: 12 mg via INTRATHECAL

## 2011-12-17 MED ORDER — LACTATED RINGERS IV SOLN
INTRAVENOUS | Status: DC | PRN
  Administered 2011-12-17 (×3): via INTRAVENOUS

## 2011-12-17 MED ORDER — LANOLIN HYDROUS EX OINT: 1.0000 "application " | TOPICAL_OINTMENT | CUTANEOUS | Status: DC | PRN

## 2011-12-17 MED ORDER — METOCLOPRAMIDE HCL 5 MG/ML IJ SOLN: 10.0000 mg | Freq: Three times a day (TID) | INTRAMUSCULAR | Status: DC | PRN

## 2011-12-17 SURGICAL SUPPLY — 33 items
CHLORAPREP W/TINT 26ML (MISCELLANEOUS) ×2 IMPLANT
CLOTH BEACON ORANGE TIMEOUT ST (SAFETY) ×2 IMPLANT
CONTAINER PREFILL 10% NBF 15ML (MISCELLANEOUS) IMPLANT
DRSG COVADERM 4X10 (GAUZE/BANDAGES/DRESSINGS) ×2 IMPLANT
ELECT REM PT RETURN 9FT ADLT (ELECTROSURGICAL) ×2
ELECTRODE REM PT RTRN 9FT ADLT (ELECTROSURGICAL) ×1 IMPLANT
EXTRACTOR VACUUM KIWI (MISCELLANEOUS) IMPLANT
EXTRACTOR VACUUM M CUP 4 TUBE (SUCTIONS) IMPLANT
GLOVE BIO SURGEON STRL SZ 6.5 (GLOVE) ×2 IMPLANT
GLOVE BIOGEL PI IND STRL 7.0 (GLOVE) ×2 IMPLANT
GLOVE BIOGEL PI INDICATOR 7.0 (GLOVE) ×2
GOWN PREVENTION PLUS LG XLONG (DISPOSABLE) ×6 IMPLANT
KIT ABG SYR 3ML LUER SLIP (SYRINGE) IMPLANT
NEEDLE HYPO 25X5/8 SAFETYGLIDE (NEEDLE) IMPLANT
NS IRRIG 1000ML POUR BTL (IV SOLUTION) ×2 IMPLANT
PACK C SECTION WH (CUSTOM PROCEDURE TRAY) ×2 IMPLANT
PAD ABD 7.5X8 STRL (GAUZE/BANDAGES/DRESSINGS) ×2 IMPLANT
PAD OB MATERNITY 4.3X12.25 (PERSONAL CARE ITEMS) IMPLANT
SLEEVE SCD COMPRESS KNEE MED (MISCELLANEOUS) ×2 IMPLANT
STAPLER VISISTAT 35W (STAPLE) IMPLANT
STRIP CLOSURE SKIN 1/2X4 (GAUZE/BANDAGES/DRESSINGS) IMPLANT
SUT MON AB 4-0 PS1 27 (SUTURE) IMPLANT
SUT PLAIN 0 NONE (SUTURE) IMPLANT
SUT PLAIN 2 0 XLH (SUTURE) IMPLANT
SUT VIC AB 0 CT1 36 (SUTURE) ×2 IMPLANT
SUT VIC AB 0 CTX 36 (SUTURE) ×3
SUT VIC AB 0 CTX36XBRD ANBCTRL (SUTURE) ×3 IMPLANT
SUT VIC AB 2-0 CT1 27 (SUTURE) ×1
SUT VIC AB 2-0 CT1 TAPERPNT 27 (SUTURE) ×1 IMPLANT
TAPE CLOTH SURG 4X10 WHT LF (GAUZE/BANDAGES/DRESSINGS) ×2 IMPLANT
TOWEL OR 17X24 6PK STRL BLUE (TOWEL DISPOSABLE) ×4 IMPLANT
TRAY FOLEY CATH 14FR (SET/KITS/TRAYS/PACK) ×2 IMPLANT
WATER STERILE IRR 1000ML POUR (IV SOLUTION) IMPLANT

## 2011-12-17 NOTE — Progress Notes (Signed)
S: tired, pushing x 3 hrs  O: Filed Vitals:   12/17/11 0432  BP:   Pulse:   Temp: 98.6 F (37 C)  Resp:    Filed Vitals:   12/16/11 2117 12/16/11 2250 12/17/11 0112 12/17/11 0432  BP:  121/76 115/52   Pulse:  96 87   Temp: 98 F (36.7 C) 98 F (36.7 C)  98.6 F (37 C)  TempSrc: Oral Oral  Axillary  Resp:  18 20   Height:      Weight:       Toco: ctx q 3 min, pitocin on FH: 140's, + accels, 10 beat variability, decels to 100's w/ pushing, have been variable in timing, occasionally appear late in timing Cvx: fully dilated, asynclitic head, caput and molding at introitus but occiput at +1 w/ no change by my exam over 30 min, no change by CNM exam over 3 hrs  A/P: Arrest of descent - d/w pt indications for c/s. R/B of c/s d/w pt, increased risks due to prolonged labor, low caput/molding - GBS pos - Overall reasurring fetal testing  Roney Youtz A. 12/17/2011 6:24 AM

## 2011-12-17 NOTE — Op Note (Signed)
12/16/2011 - 12/17/2011  7:51 AM  PATIENT:  [name] [name]  31 y.o. female  PRE-OPERATIVE DIAGNOSIS:  Arrest of Descent  POST-OPERATIVE DIAGNOSIS:  Arrest of Descent  PROCEDURE:  Procedure(s) (LRB): CESAREAN SECTION (N/A)  SURGEON:  Surgeon(s) and Role:    Tresa Endo A. Ernestina Penna, MD - Primary  PHYSICIAN ASSISTANT:   ASSISTANTS: Ivonne Andrew, CNM   ANESTHESIA:   spinal  EBL:  Total I/O In: 1300 [I.V.:1300] Out: 1100 [Urine:400; Blood:700]  BLOOD ADMINISTERED:none  DRAINS: Urinary Catheter (Foley)   LOCAL MEDICATIONS USED:  NONE  SPECIMEN:  Source of Specimen:  Placenta  DISPOSITION OF SPECIMEN:  N/A  COUNTS:  YES  TOURNIQUET:  * No tourniquets in log *  DICTATION: .Note written in EPIC  PLAN OF CARE: Admit to inpatient   PATIENT DISPOSITION:  PACU - hemodynamically stable.   Delay start of Pharmacological VTE agent (>24hrs) due to surgical blood loss or risk of bleeding: yes   Indications: arrest of descent      Findings:  @BABYSEXEBC @ infant,  APGAR (1 MIN): 8   APGAR (5 MINS): 9   APGAR (10 MINS):    Nl uterus, tubes, ovaries, nl placenta, 3 VC, R lower extension, female, ROT EBL: 700 cc Antibiotics:  2g Ancef     Complications: none  Indications: This is a 31 y.o. year-old, G1  At [redacted]w[redacted]d admitted for SROM. Risks benefits and alternatives of the procedure were discussed with the patient who agreed to proceed  Procedure:  After informed consent was obtained the patient was taken to the operating room where spinal anesthesia was initiated.  She was prepped and draped in the normal sterile fashion in dorsal supine position with a leftward tilt.  A foley catheter was inserted sterilely into the bladder . Gloves were changes and attention was turned to the patient's abdomen. A Pfannenstiel skin incision was made 2 cm above the pubic symphysis in the midline with the scalpel.  Dissection was carried down with the Bovie cautery until the fascia was reached. The  fascia was incised in the midline. The incision was extended laterally with the Mayo scissors. The inferior aspect of the fascial incision was grasped with the Coker clamps, elevated up and the underlying rectus muscles were dissected off sharply. The superior aspect of the fascial incision was grasped with the Coker clamps elevated up and the underlying rectus muscles were dissected off sharply.  The peritoneum was entered bluntly. The peritoneal incision was extended superiorly and inferiorly with good visualization of the bladder. The bladder blade was inserted, the vesicouterine peritoneum was identified grasped with the pickups and entered sharply. The bladder flap was created digitally the bladder blade was reinserted. Palpation was done to assess the fetal position and the location of the uterine vessels. Given the low station of the caput, a pre-operative decision was made to elevated the head from the vagina prior to uterine incision. CNM placed a hand in the vagina to elevated the head.  The lower segment of the uterus was incised sharply with the scalpel and extended superiorly and laterally with the bandage scissors. The infant also was grasped brought to the incision,  rotated and the infant was delivered with fundal pressure. The nose and mouth were bulb suctioned. The cord was clamped and cut. The infant was handed off to the waiting pediatrician. The placenta was expressed. The uterus was exteriorized. The uterus was cleared of all clots and debris. The inferior extension was repaired primarily. The uterine incision was  repaired with 0 Vicryl in a running locked fashion.  A second layer of the same suture was used in an imbricating fashion to obtain excellent hemostasis.  The uterus was then returned to the abdomen, the gutters were cleared of all clots and debris and the pelvis was irrigated. The uterine incision was reinspected and found to be hemostatic. The peritoneum was grasped and closed with  2-0 Vicryl in a running fashion. The cut muscle edges and the underside of the fascia were inspected and found to be hemostatic. The fascia was closed with 0 Vicryl in a single layer. The subcutaneous tissue was irrigated. Scarpa's layer was closed with a 2-0 plain gut suture. The skin was closed with staples. The patient tolerated the procedure well. Sponge lap and needle counts were correct x3 and patient was taken to the recovery room in a stable condition.  Guelda Batson A. 12/17/2011 7:55 AM

## 2011-12-17 NOTE — Anesthesia Preprocedure Evaluation (Addendum)
Anesthesia Evaluation  Patient identified by MRN, date of birth, ID band Patient awake    Reviewed: Allergy & Precautions, H&P , NPO status , Patient's Chart, lab work & pertinent test results  Airway Mallampati: II TM Distance: >3 FB Neck ROM: Full    Dental No notable dental hx. (+) Teeth Intact   Pulmonary neg pulmonary ROS,  breath sounds clear to auscultation  Pulmonary exam normal       Cardiovascular negative cardio ROS  Rhythm:Regular Rate:Normal     Neuro/Psych negative psych ROS   GI/Hepatic negative GI ROS, Neg liver ROS,   Endo/Other  negative endocrine ROS  Renal/GU negative Renal ROS  negative genitourinary   Musculoskeletal negative musculoskeletal ROS (+)   Abdominal Normal abdominal exam  (+)   Peds  Hematology negative hematology ROS (+)   Anesthesia Other Findings   Reproductive/Obstetrics (+) Pregnancy                          Anesthesia Physical Anesthesia Plan  ASA: II and Emergent  Anesthesia Plan: Spinal   Post-op Pain Management:    Induction:   Airway Management Planned: Natural Airway  Additional Equipment:   Intra-op Plan:   Post-operative Plan:   Informed Consent: I have reviewed the patients History and Physical, chart, labs and discussed the procedure including the risks, benefits and alternatives for the proposed anesthesia with the patient or authorized representative who has indicated his/her understanding and acceptance.   Dental Advisory Given  Plan Discussed with: Anesthesiologist, CRNA and Surgeon  Anesthesia Plan Comments:        Anesthesia Quick Evaluation

## 2011-12-17 NOTE — Progress Notes (Signed)
S: Feeling "really intense ctx" - requesting a little pain med at this time - low dose as very sensitive to pain meds     Tolerating contractions well - breathing / activity ad lib / family at bedside   O:  VS: Blood pressure 121/76, pulse 96, temperature 98 F (36.7 C), temperature source Oral, resp. rate 18, height 5\' 2"  (1.575 m), weight 59.875 kg (132 lb), last menstrual period 03/10/2011, unknown if currently breastfeeding.        FHR : baseline 150 / variability moderate / accels + / decels none        Toco: contractions every 3 minutes / strong ctx         Cervix : by nurse in past 1/2 hour - states 6cm / 90% / vtx         A: Active labor     FHR category 1  P: continue current management       nubain 5 mg IV prn for pain offered - unsure if she really wants it once medication was offered  Marlinda Mike CNM, MSN 12/17/2011, 12:21 AM

## 2011-12-17 NOTE — Brief Op Note (Signed)
12/16/2011 - 12/17/2011  7:51 AM  PATIENT:  [name] [name]  31 y.o. female  PRE-OPERATIVE DIAGNOSIS:  Arrest of Descent  POST-OPERATIVE DIAGNOSIS:  Arrest of Descent  PROCEDURE:  Procedure(s) (LRB): CESAREAN SECTION (N/A)  SURGEON:  Surgeon(s) and Role:    Tresa Endo A. Ernestina Penna, MD - Primary  PHYSICIAN ASSISTANT:   ASSISTANTS: Ivonne Andrew, CNM   ANESTHESIA:   spinal  EBL:  Total I/O In: 1300 [I.V.:1300] Out: 1100 [Urine:400; Blood:700]  BLOOD ADMINISTERED:none  DRAINS: Urinary Catheter (Foley)   LOCAL MEDICATIONS USED:  NONE  SPECIMEN:  Source of Specimen:  Placenta  DISPOSITION OF SPECIMEN:  N/A  COUNTS:  YES  TOURNIQUET:  * No tourniquets in log *  DICTATION: .Note written in EPIC  PLAN OF CARE: Admit to inpatient   PATIENT DISPOSITION:  PACU - hemodynamically stable.   Delay start of Pharmacological VTE agent (>24hrs) due to surgical blood loss or risk of bleeding: yes

## 2011-12-17 NOTE — Progress Notes (Signed)
Marlinda Mike, CNM updated on patient status while in department.

## 2011-12-17 NOTE — Anesthesia Procedure Notes (Signed)
Spinal  Patient location during procedure: OR Start time: 12/17/2011 6:45 AM Staffing Anesthesiologist: Rydge Texidor A. Performed by: anesthesiologist  Preanesthetic Checklist Completed: patient identified, site marked, surgical consent, pre-op evaluation, timeout performed, IV checked, risks and benefits discussed and monitors and equipment checked Spinal Block Patient position: sitting Prep: site prepped and draped and DuraPrep Patient monitoring: heart rate, cardiac monitor, continuous pulse ox and blood pressure Approach: midline Location: L3-4 Injection technique: single-shot Needle Needle type: Sprotte  Needle gauge: 24 G Needle length: 9 cm Needle insertion depth: 4 cm Assessment Sensory level: T4 Additional Notes Patient tolerated procedure well. Adequate sensory level.

## 2011-12-17 NOTE — Transfer of Care (Signed)
Immediate Anesthesia Transfer of Care Note  Patient: [name] [name]  Procedure(s) Performed: Procedure(s) (LRB): CESAREAN SECTION (N/A)  Patient Location: PACU  Anesthesia Type: Spinal  Level of Consciousness: awake, alert  and oriented  Airway & Oxygen Therapy: Patient Spontanous Breathing  Post-op Assessment: Report given to PACU RN and Post -op Vital signs reviewed and stable  Post vital signs: stable  Complications: No apparent anesthesia complications

## 2011-12-17 NOTE — Progress Notes (Signed)
S: Exhausted - pushing for 2 hours      O:  VS: Blood pressure 115/52, pulse 87, temperature 98.6 F (37 C), temperature source Axillary, resp. rate 20, height 5\' 2"  (1.575 m), weight 59.875 kg (132 lb), last menstrual period 03/10/2011, unknown if currently breastfeeding.        FHR : baseline 150 / variability moderate / accels -none / decels variables with pushing to 90-100 x 10-20 sec        Toco: contractions every 2-3 minutes / pitocin 6 mu/min          VTX +2 with caput / asynclitic oblique  A: second stage of labor      P: Dr Ernestina Penna updated with status      MD to assume care in next hour   Marlinda Mike CNM, MSN 12/17/2011, 5:23 AM

## 2011-12-17 NOTE — Progress Notes (Signed)
S: Feeling some pressure      Tolerating contractions well - nubain effective   O:  VS: Blood pressure 115/52, pulse 87, temperature 98 F (36.7 C), temperature source Oral, resp. rate 20, height 5\' 2"  (1.575 m), weight 59.875 kg (132 lb), last menstrual period 03/10/2011, unknown if currently breastfeeding.        FHR : baseline 140 / variability moderate / accels none / decels none        Toco: contractions every 2-4 minutes / pitocin at 4 mu/min         Cervix : 10 / 100% / vtx + 1        Membranes: heavy show  A: active labor     FHR category 1  P: begin active second stage with strong urge to push     Labor support as needed   Marlinda Mike CNM, MSN 12/17/2011, 2:38 AM

## 2011-12-17 NOTE — Anesthesia Postprocedure Evaluation (Signed)
Anesthesia Post Note  Patient: [name] [name]  Procedure(s) Performed: Procedure(s) (LRB): CESAREAN SECTION (N/A)  Anesthesia type: Spinal  Patient location: PACU  Post pain: Pain level controlled  Post assessment: Post-op Vital signs reviewed  Last Vitals:  Filed Vitals:   12/17/11 0930  BP: 107/66  Pulse: 83  Temp: 37.1 C  Resp: 12    Post vital signs: Reviewed  Level of consciousness: awake  Complications: No apparent anesthesia complications

## 2011-12-18 LAB — CBC
MCH: 33.6 pg (ref 26.0–34.0)
MCV: 98 fL (ref 78.0–100.0)
Platelets: 166 10*3/uL (ref 150–400)
RBC: 2.98 MIL/uL — ABNORMAL LOW (ref 3.87–5.11)
RDW: 13.6 % (ref 11.5–15.5)
WBC: 17.5 10*3/uL — ABNORMAL HIGH (ref 4.0–10.5)

## 2011-12-18 NOTE — Progress Notes (Signed)
POSTOPERATIVE DAY # 1 S/P cesarean section   S:         Reports feeling well - satisfied with birth experience and happy with her decisions             Tolerating po intake / no nausea / no vomiting / + flatus / no BM             Bleeding is light             Pain controlled with motrin and percocet             Up ad lib / ambulatory with difficulty / voiding QS  Newborn breast-feeding  / circumcision NOT planned   O:  A & O x 3              VS: Blood pressure 101/63, pulse 78, temperature 97.6 F (36.4 C), temperature source Oral, resp. rate 18, height 5\' 2"  (1.575 m), weight 132 lb (59.875 kg), last menstrual period 03/10/2011, SpO2 99.00%, unknown if currently breastfeeding.  LABS: WBC/Hgb/Hct/Plts:  17.5/10.0/29.2/166 (08/31 0545)   I&O:    + 1276  Lungs: Clear and unlabored  Heart: regular rate and rhythm  Abdomen: soft, non-tender, mildly distended             Fundus: firm, non-tender, Ueven             Dressing intact without drainage              Perineum: no edema  Lochia: light  Extremities: no edema, no calf pain or tenderness  A:        POD # 1 S/P cesarean section            Mild iron deficiency anemia - ABL  P:        Routine postoperative care              Ambulate today / warm PO intake to facilitate bowel motility             Shower and remove dressing             Consider iron at discharge when bowels more active   [name] [name] CNM, MSN 12/18/2011, 10:39 AM

## 2011-12-18 NOTE — Progress Notes (Signed)
CSW reviewed chart.  Awaiting infant UDS results and will complete full assessment. 

## 2011-12-18 NOTE — Progress Notes (Signed)
CSW aware of consult.  Awaiting UDS results and will complete full assessment. 

## 2011-12-18 NOTE — Anesthesia Postprocedure Evaluation (Signed)
  Anesthesia Post-op Note  Patient: [name] [name]  Procedure(s) Performed: Procedure(s) (LRB): CESAREAN SECTION (N/A)  Patient Location: Mother/Baby  Anesthesia Type: Spinal  Level of Consciousness: awake  Airway and Oxygen Therapy: Patient Spontanous Breathing  Post-op Pain: mild  Post-op Assessment: Patient's Cardiovascular Status Stable and Respiratory Function Stable  Post-op Vital Signs: stable  Complications: No apparent anesthesia complications

## 2011-12-18 NOTE — Progress Notes (Signed)
Clinical Social Work Department PSYCHOSOCIAL ASSESSMENT - MATERNAL/CHILD 12/18/2011  Patient:  [name],[name]  Account Number:  [id number]  Admit Date:  12/16/2011  Childs Name:   [name] [name]    Clinical Social Worker:  Keen Ewalt, LCSW   Date/Time:  12/18/2011 03:00 PM  Date Referred:  12/18/2011   Referral source  Physician     Referred reason  Depression/Anxiety  Substance Abuse   Other referral source:    I:  FAMILY / HOME ENVIRONMENT Child's legal guardian:  PARENT  Guardian - Name Guardian - Age Guardian - Address  [name] [name] 30 [address] [place] [address]  [name] [name]  [address] [place] [address]   Other household support members/support persons Name Relationship DOB  none     Other support:   MGM in room.  MOB reports lots of family support.    II  PSYCHOSOCIAL DATA Information Source:  Patient Interview  Financial and Community Resources Employment:   Financial resources:  Private Insurance If Medicaid - County:    School / Grade:   Maternity Care Coordinator / Child Services Coordination / Early Interventions:  Cultural issues impacting care:    III  STRENGTHS Strengths  Adequate Resources  Home prepared for Child (including basic supplies)  Supportive family/friends  Compliance with medical plan   Strength comment:    IV  RISK FACTORS AND CURRENT PROBLEMS Current Problem:  None   Risk Factor & Current Problem Patient Issue Family Issue Risk Factor / Current Problem Comment   N N     V  SOCIAL WORK ASSESSMENT CSW spoke with MOB and MGM in room.  Questioned MOB about any emotional concerns, and she reported none.  Discussed any concerns with supplies.  MOB was interested in WIC and seeing what she could qualify for.  Provided information about DSS and WIC services and instructed MOB to look into this as soon as she can.  Discussed family support and MOB stated she has lots of family and no concerns when she  discharges.  MOB has hx of drug use.  MD felt this hx was not significant and did not order UDS for infant.  Please reconsult CSW if further needs arise.  No concerns with discharge at this time.      VI SOCIAL WORK PLAN Social Work Plan  No Further Intervention Required / No Barriers to Discharge   Type of pt/family education:   WIC and information about DSS services.   If child protective services report - county:   If child protective services report - date:   Information/referral to community resources comment:   Other social work plan:    

## 2011-12-18 NOTE — Addendum Note (Signed)
Addendum  created 12/18/11 0827 by Renford Dills, CRNA   Modules edited:Notes Section

## 2011-12-19 MED ORDER — OXYCODONE-ACETAMINOPHEN 5-325 MG PO TABS: 1.0000 | ORAL_TABLET | ORAL | Status: AC | PRN

## 2011-12-19 MED ORDER — IBUPROFEN 800 MG PO TABS: 800.0000 mg | ORAL_TABLET | Freq: Three times a day (TID) | ORAL | Status: AC | PRN

## 2011-12-19 NOTE — Progress Notes (Signed)
POSTOPERATIVE DAY # 2 S/P cesarean sction   S:         Reports feeling well - desires discharge today             Tolerating po intake / no nausea / no vomiting / +flatus / no BM             Bleeding is spotting             Pain controlled with motin - still having pain but hesitant to take percocet             Up ad lib / ambulatory  Newborn breast feeding     O:  A & O x 3              VS: Blood pressure 107/70, pulse 70, temperature 98.3 F (36.8 C), temperature source Oral, resp. rate 18, height 5\' 2"  (1.575 m), weight 59.875 kg (132 lb), last menstrual period 03/10/2011, SpO2 95.00%, unknown if currently breastfeeding.  Lungs: Clear and unlabored  Heart: regular rate and rhythm  Abdomen: soft, non-tender, non-distended, active BS             Fundus: firm, non-tender, U-2             Dressing OFF              Incision:  approximated with staples / no erythema / no ecchymosis / no drainage  Perineum: no edema  Lochia: light  Extremities: trace pedal edema, no calf pain or tenderness, negative Homans  A:        POD # 2 S/P cesarean section            Breastfeeding well-established            Mild dependent edema            pain  P:        Routine postoperative care              Encouraged to take 1/2 -1 percocet for pain - need to control pain postoperatively / narcotic needed for 2-5 days             Push water hydration - elevate feet - edema will resolve in 1 week             Discharge home - [facility] booklet / instructions reviewed   Marlinda Mike CNM, MSN 12/19/2011, 8:56 AM

## 2011-12-19 NOTE — Discharge Summary (Signed)
POSTOPERATIVE DISCHARGE SUMMARY:  Patient ID: [name] [name] MRN: [id number] DOB/AGE: 1980-06-02 31 y.o.  Admit date: 12/16/2011 Discharge date:  12/19/2011  Admission Diagnoses: PROM at term (no active labor) / + GBS carrier    Discharge Diagnoses:   POD # 2 cesaran section for arrest of descent / mild ABL anemia - stable  Prenatal history: G2P1001   EDC : 12/15/2011, by Last Menstrual Period  Prenatal care at [facility]  Primary provider : Dr Ernestina Penna Prenatal course complicated by + GBS bacturia  Prenatal Labs: ABO, Rh: O (01/16 0000) positive Antibody: Negative (01/16 0000) Rubella: Immune (01/16 0000)  RPR: NON REACTIVE (08/29 1145)  HBsAg: Negative (01/16 0000)  HIV: Non-reactive (01/16 0000)  GBS: Positive (01/16 0000)  1 hr Glucola : NL  Medical / Surgical History :  Past medical history:  Past Medical History  Diagnosis Date  . Allergy   . Migraine   . Skin tag of anus     bleeding with them  . Skin cancer, basal cell     lower left back  . Anxiety     Past surgical history:  Past Surgical History  Procedure Date  . Basal cell skin cancer removed 2010    lower left back    Family History:  Family History  Problem Relation Age of Onset  . Hypertension Mother   . Diabetes Mother   . Heart disease Father     suddent death ?  about age 44   . Healthy Sister   . Hypertension Brother   . Healthy Sister   . Alcohol abuse      MGF  . Breast cancer      GPs  . Lung cancer      gp     Social History:  reports that she has never smoked. She does not have any smokeless tobacco history on file. She reports that she drinks alcohol. She reports that she does not use illicit drugs.  Allergies: Moxifloxacin   Current Medications at time of admission:  Prior to Admission medications   Medication Sig Start Date End Date Taking? Authorizing Provider  Prenatal Vit-Fe Fumarate-FA (PRENATAL MULTIVITAMIN) TABS Take 1 tablet by mouth every  morning.   Yes Historical Provider, MD    Intrapartum Course: Admit for SROM and irregular ctx / PCN protocol for GBS prophylaxis initiated at time of admission / no progression to active labor after 8 hours - pitocin augmentation initiated (Pitocin dose max 64mu/min) - single dose Nubain 5 mg IV for pain / active second stage for over 3 hours without descent for delivery / decision for cesarean section after 24 hour ROM / 3+ hours active second stage without epidural / vtx OA anterior - asynclitic in miltary position  Procedures: Cesarean section delivery of female newborn by Dr Ernestina Penna  See operative report for further details  Postoperative / postpartum course: uneventful with early discharge on POD 2  Physical Exam:   VSS: Blood pressure 107/70, pulse 70, temperature 98.3 F (36.8 C), temperature source Oral, resp. rate 18, height 5\' 2"  (1.575 m), weight 59.875 kg (132 lb), last menstrual period 03/10/2011, SpO2 95.00%, unknown if currently breastfeeding.  LABS:   preop HGB 13.5 / postop hgb 10.0 General: pleasant and NAD Heart:RRR Lungs:Clear Abdomen:soft / active bowel sounds / uterus firm and non-tender Extremities: trace dependent edema   Incision:  approximated with staples / no erythema / no ecchymosis / no drainage Staples: intact for removal at [facility]  POD 4  Discharge Instructions:  Discharged Condition: stable Activity: pelvic rest and postoperative restrictions x 2 weeks Diet: routine Medications: PNV, Ibuprofen and Percocet Medication List  As of 12/19/2011  9:02 AM   STOP taking these medications         Evening Primrose Oil Caps         TAKE these medications         ibuprofen 800 MG tablet   Commonly known as: ADVIL,MOTRIN   Take 1 tablet (800 mg total) by mouth every 8 (eight) hours as needed for pain.      oxyCODONE-acetaminophen 5-325 MG per tablet   Commonly known as: PERCOCET/ROXICET   Take 1 tablet by mouth every 4 (four) hours as needed (moderate -  severe pain).      prenatal multivitamin Tabs   Take 1 tablet by mouth every morning.           Condition: stable Postpartum Instructions: refer to practice specific booklet Discharge to: home Disposition: Final discharge disposition not confirmed Follow up :  Follow-up Information    Follow up with [facility]. Schedule an appointment as soon as possible for a visit in 2 days. (staple removal in 2-3 days)           Signed: Marlinda Mike CNM, MSN 12/19/2011, 9:02 AM

## 2011-12-20 ENCOUNTER — Encounter (HOSPITAL_COMMUNITY): Payer: Self-pay | Admitting: Obstetrics

## 2011-12-21 NOTE — H&P (Signed)
[name] [name] is a 31 y.o. G2P1001 at term w/ ROM. Pt w/ uncomplicated PN history, planning Bradley labor, would like to avoid all intervention. Pt w/ ROM at 6 am on day of admission and mild ctx. No VB, good FM  [facility] at [facility] since early 1st trimester - b/l choroid plexus cysts -transfer for midwifery care, considering waterbirth  OB History    Grav Para Term Preterm Abortions TAB SAB Ect Mult Living   2 1 1  0 0 0 0 0 0 1     Past Medical History  Diagnosis Date  . Allergy   . Migraine   . Skin tag of anus     bleeding with them  . Skin cancer, basal cell     lower left back  . Anxiety    Past Surgical History  Procedure Date  . Basal cell skin cancer removed 2010    lower left back  . Cesarean section 12/17/2011    Procedure: CESAREAN SECTION;  Surgeon: Tresa Endo A. Ernestina Penna, MD;  Location: [facility];  Service: Gynecology;  Laterality: N/A;   Family History: family history includes Alcohol abuse in an unspecified family member; Breast cancer in an unspecified family member; Diabetes in her mother; Healthy in her sisters; Heart disease in her father; Hypertension in her brother and mother; and Lung cancer in an unspecified family member. Social History:  reports that she has never smoked. She does not have any smokeless tobacco history on file. She reports that she drinks alcohol. She reports that she does not use illicit drugs.  Review of Systems - Negative except ROM   Physical Exam:  Gen: well appearing, no distress, slight anxiety Back: no CVAT Abd: gravid, NT, no RUQ pain LE: no edema, equal bilaterally, non-tender Toco: irritibility vs ctx q 5 FH:  accelerations present, no deceleratons, 10 beat variability GU: gross ROM, cvx 3 cm  Prenatal labs: ABO, Rh: O/Positive/-- (01/16 0000) Antibody: Negative (01/16 0000) Rubella:   RPR: NON REACTIVE (08/29 1145)  HBsAg: Negative (01/16 0000)  HIV: Non-reactive (01/16 0000)  GBS: Positive (01/16 0000)  1 hr  Glucola nl     Assessment/Plan: 31 y.o. G2P1001 at [redacted]w[redacted]d ROM, plan expectant management GBS pos, needs PCN Reactive fetal testing  Miguelina Fore A. 12/21/2011 4:08 PM     Auriana Scalia A. 12/21/2011, 4:05 PM

## 2011-12-23 ENCOUNTER — Encounter (HOSPITAL_COMMUNITY): Payer: Self-pay | Admitting: *Deleted

## 2013-04-17 LAB — OB RESULTS CONSOLE ABO/RH: RH TYPE: POSITIVE

## 2013-04-17 LAB — OB RESULTS CONSOLE RUBELLA ANTIBODY, IGM: RUBELLA: IMMUNE

## 2013-04-17 LAB — OB RESULTS CONSOLE HEPATITIS B SURFACE ANTIGEN: Hepatitis B Surface Ag: NEGATIVE

## 2013-04-17 LAB — OB RESULTS CONSOLE ANTIBODY SCREEN: Antibody Screen: NEGATIVE

## 2013-04-17 LAB — OB RESULTS CONSOLE GC/CHLAMYDIA
CHLAMYDIA, DNA PROBE: NEGATIVE
Gonorrhea: NEGATIVE

## 2013-04-17 LAB — OB RESULTS CONSOLE HIV ANTIBODY (ROUTINE TESTING): HIV: NONREACTIVE

## 2013-04-19 NOTE — L&D Delivery Note (Addendum)
  Vaginal Delivery Note  The pt used Nubain as labor pain management.  The membrane ruptured at 0914, on 12/12/13, clear. Pt was complete at  8:45am.  FHR Catagory 1.    Self directed pushing began at  8:50am.   After 2 hour(s) and 4 minutes of pushing the shoulders and the body of a viable female infant Loraine Leriche) delivered (VBAC) with maternal effort in the OA position at 10:54 Vigorous tone and spontaneous cry, the infant was placed on moms abd.   The cord was clamped then cut by the FOB.   Spontaneous delivery of a intact placenta with a 3 vessel cord via Shultz at 11:02am.  Episiotomy: None The vulva, perineum, vaginal vault, rectum and cervix were inspected and revealed a 2 degree perineal and a left labial laceration which was repair using a 3-0 vicryl on a CT needle and 30cc of 1% lidocaine.  Postpartum pitocin as ordered.   Fundus boggy, fundal massage, 1000 mcg of Cytotec PR. EBL 350, Pt hemodynamically stable.  Sponge, laps and needle count correct and verified.  Attending MD available at all times.    Mom and baby were left in stable condition, baby skin to skin.  Routine postpartum orders Mother unsure about contraception   Placenta to pathology: NO, Pt desires to take it home Cord blood sent to lab: YES Cord Gases sent to lab: NO  APGARS: 8 / 9 Weight:.  8lb 5.7oz   Ronelle Smallman, CNM, MSN 12/12/2013. 12:01 PM

## 2013-07-04 IMAGING — US US OB DETAIL+14 WK
1 series · 12 of 28 positions shown · non-contrast
Comparison: none

[Series 1: us ob detail+14 wk · 0.16mm/px · 12 of 106 slices shown]
[im 4/106]
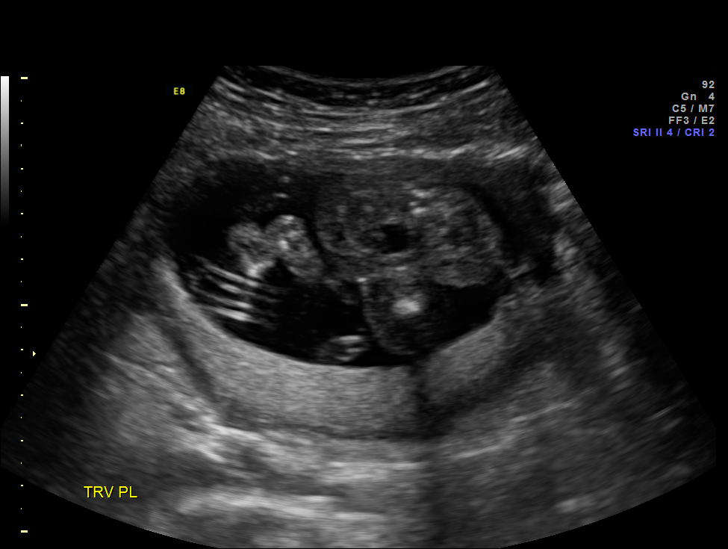
[im 12/106]
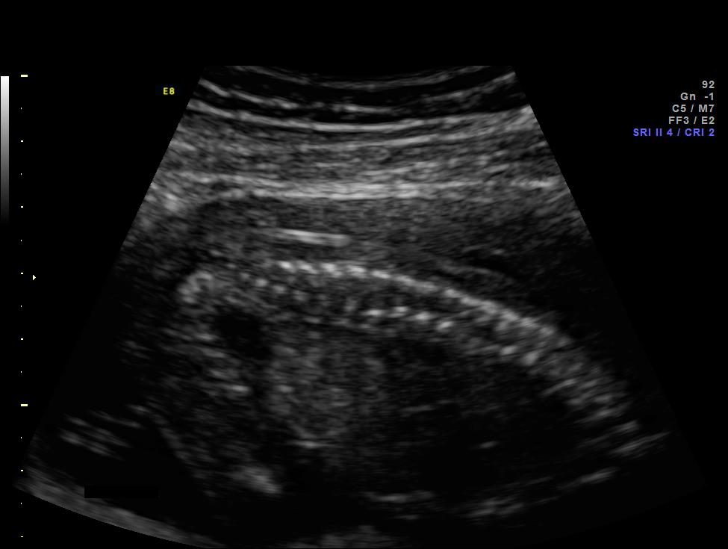
[im 20/106]
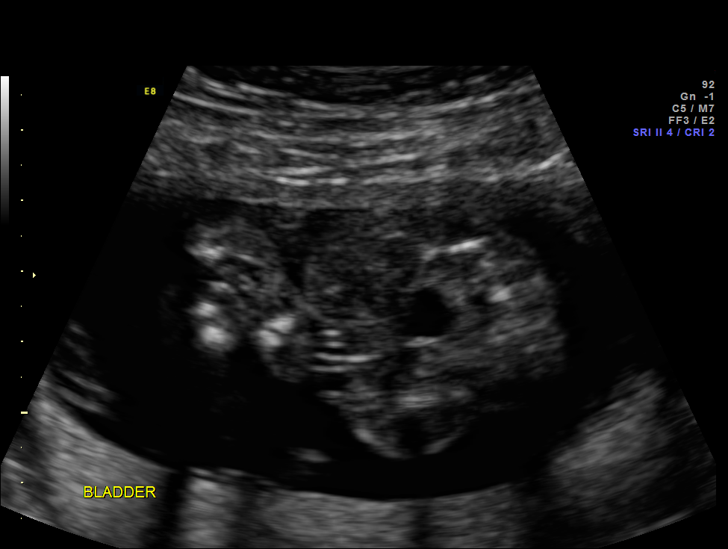
[im 32/106]
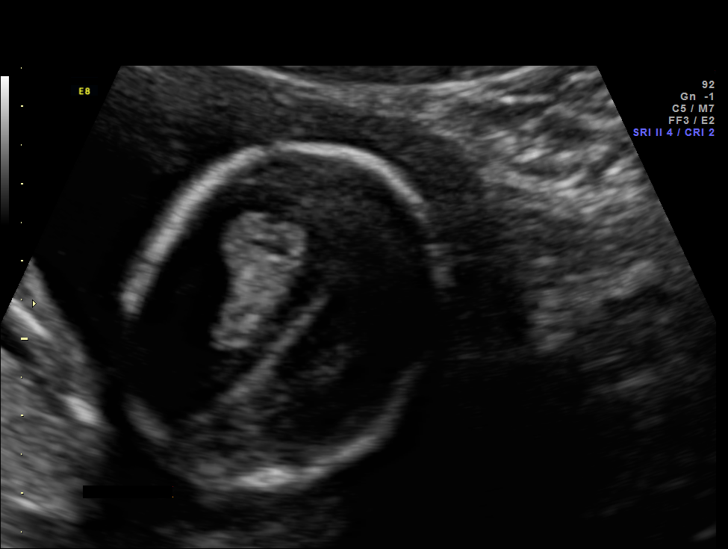
[im 39/106]
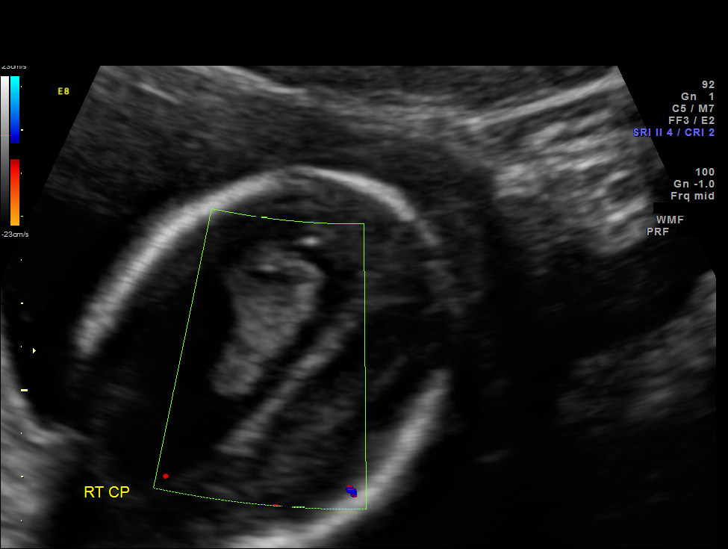
[im 47/106]
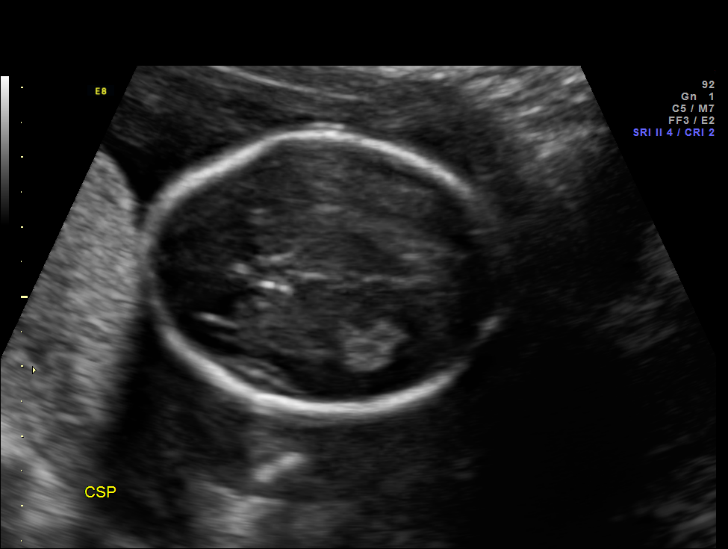
[im 59/106]
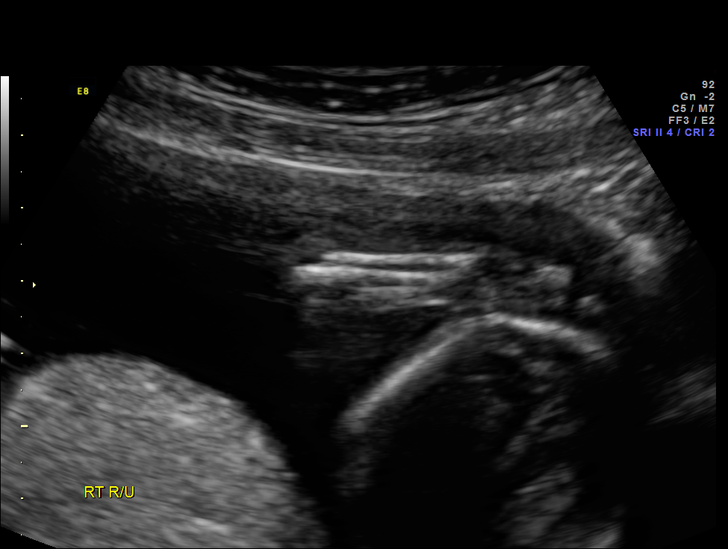
[im 67/106]
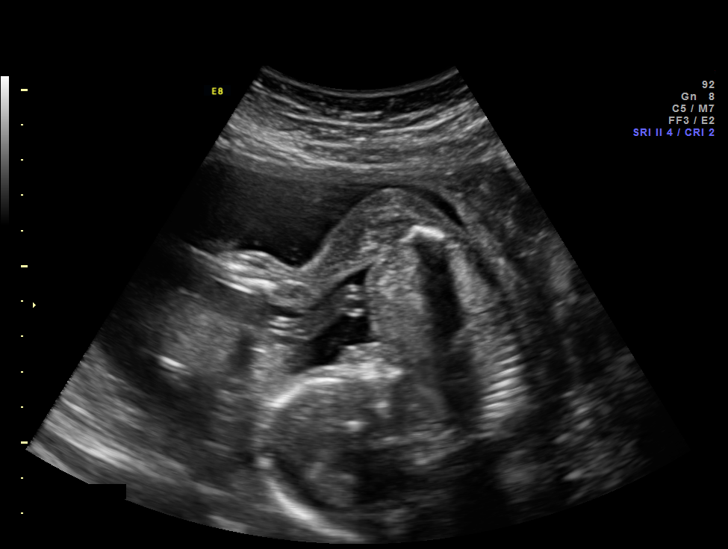
[im 74/106]
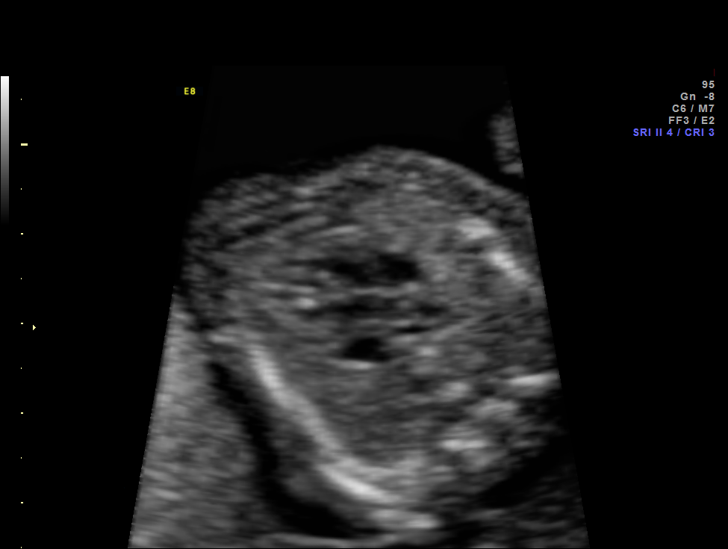
[im 86/106]
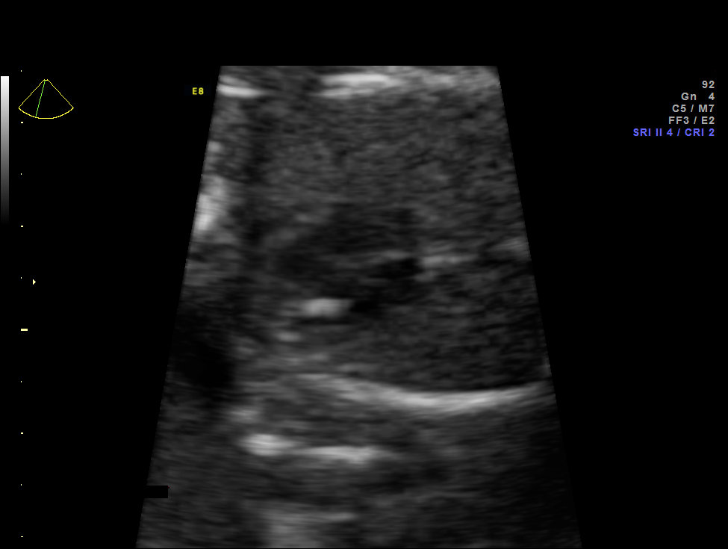
[im 94/106]
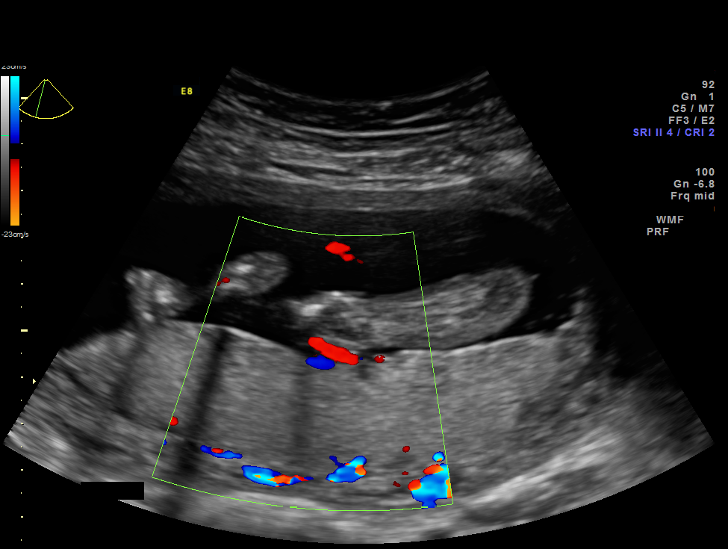
[im 102/106]
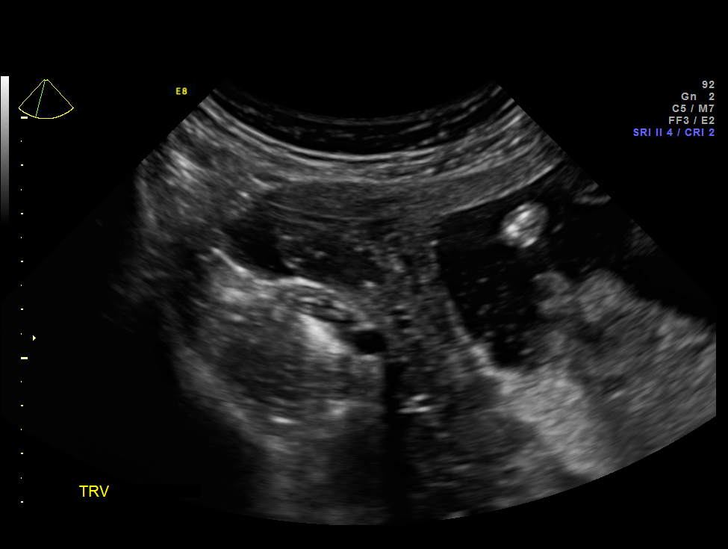

[12 of 28 positions shown; findings below may reference images not displayed]

OBSTETRICS REPORT
                      (Signed Final 07/16/2011 [DATE])

 Order#:         [id number]
Procedures

 US OB DETAIL + 14 WK                                  76811.0
Indications

 Detailed fetal anatomic survey
 Family history of DiGeorge Syndrome
 FOB with congenital cardiac disease (bicuspid
 aortic valve)
Fetal Evaluation

 Fetal Heart Rate:  159                          bpm
 Cardiac Activity:  Observed
 Presentation:      Cephalic
 Placenta:          Posterior, above cervical
                    os
 P. Cord            Appears WNL
 Insertion:

 Amniotic Fluid
 AFI FV:      Subjectively within normal limits
                                             Larg Pckt:     3.3  cm
Biometry

 BPD:       39  mm     G. Age:  17w 6d                CI:         72.5   70 - 86
 OFD:     53.8  mm                                    FL/HC:      17.6   15.8 -
                                                                         18
 HC:     148.6  mm     G. Age:  18w 0d       27  %    HC/AC:      1.16   1.07 -

 AC:     128.3  mm     G. Age:  18w 3d       51  %    FL/BPD:
 FL:      26.1  mm     G. Age:  18w 0d       32  %    FL/AC:      20.3   20 - 24
 HUM:     25.4  mm     G. Age:  18w 0d       45  %
 CER:     18.5  mm     G. Age:  18w 2d       48  %
 NFT:      3.5  mm

 Est. FW:     226  gm      0 lb 8 oz     44  %
Gestational Age

 LMP:           18w 2d        Date:  03/10/11                 EDD:   12/15/11
 U/S Today:     18w 1d                                        EDD:   12/16/11
 Best:          18w 2d     Det. By:  LMP  (03/10/11)          EDD:   12/15/11
Anatomy

 Cranium:           Appears normal      Aortic Arch:       Appears normal
 Fetal Cavum:       Appears normal      Ductal Arch:       Not well
                                                           visualized
 Ventricles:        Appears normal      Diaphragm:         Appears normal
 Choroid Plexus:    Bilateral           Stomach:           Appears
                    choroid plexus
                    cysts
                                                           normal, left
                                                           sided
 Cerebellum:        Appears normal      Abdomen:           Appears normal
 Posterior Fossa:   Appears normal      Abdominal Wall:    Appears nml
                                                           (cord insert,
                                                           abd wall)
 Nuchal Fold:       Appears normal      Cord Vessels:      Appears normal
                    (neck, nuchal                          (3 vessel cord)
                    fold)
 Face:              Appears normal      Kidneys:           Appear normal
                    (lips/profile/orbit
                    s)
 Heart:             Echogenic           Bladder:           Appears normal
                    focus in LV
 RVOT:              Appears normal      Spine:             Appears normal
 LVOT:              Appears normal      Limbs:             Appears normal
                                                           (hands, ankles,
                                                           feet)

 Other:     Heels and 5th digit visualized. Nasal bone visualized.
            Technically difficult due to fetal position.
Targeted Anatomy

 Fetal Central Nervous System
 Cisterna Magna:
Cervix Uterus Adnexa

 Cervical Length:    3        cm

 Cervix:       Normal appearance by transabdominal scan.

 Left Ovary:    Within normal limits.
 Right Ovary:   Within normal limits.
 Adnexa:     No abnormality visualized.
Comments

 Bilateral choroid plexus cysts and an echogenic intracardiac
 foci are seen today.  Ultrasound findings were discussed and
 the patient received genetic counseling (see separate report).
 She elected to proceed with maternal serum evaluation of
 fetal cell free fetal DNA.  This was drawn in our office today.

 Fetal echo was also recommended.  Patient states that she
 has this scheduled.
Impression

 IUP at 18 weeks 2 day
 Fetal measurements consistent with dating by LMP
 Normal amniotic fluid volume
 Normal fetal anatomic survey (some limited cardiac views)
 Bilateral choroid plexus cysts
 Echogenic intracardiac foci
Recommendations

 Repeat ultrasound scheduled in 6-8 weeks to re evaluate
 fetal anatomy.

                Geis, Melony

## 2013-08-15 IMAGING — US US OB FOLLOW-UP
1 series · 12 of 28 positions shown · non-contrast
Comparison: none

[Series 1: us ob follow-up · 12 of 37 slices shown]
[im 2/37]
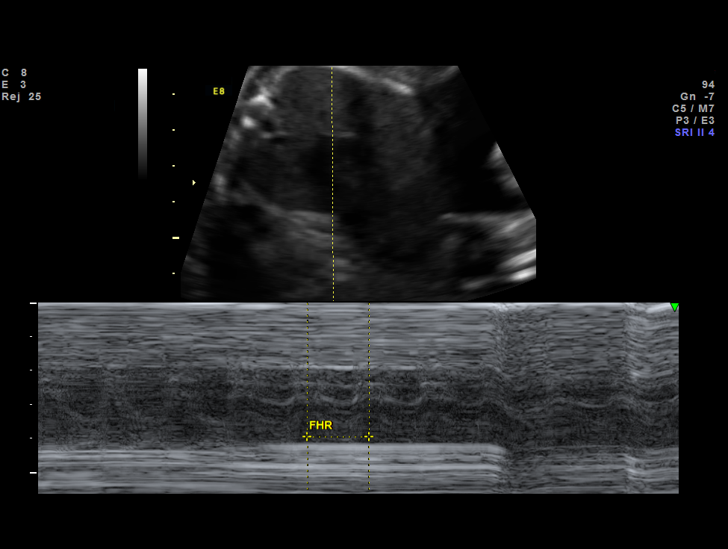
[im 5/37]
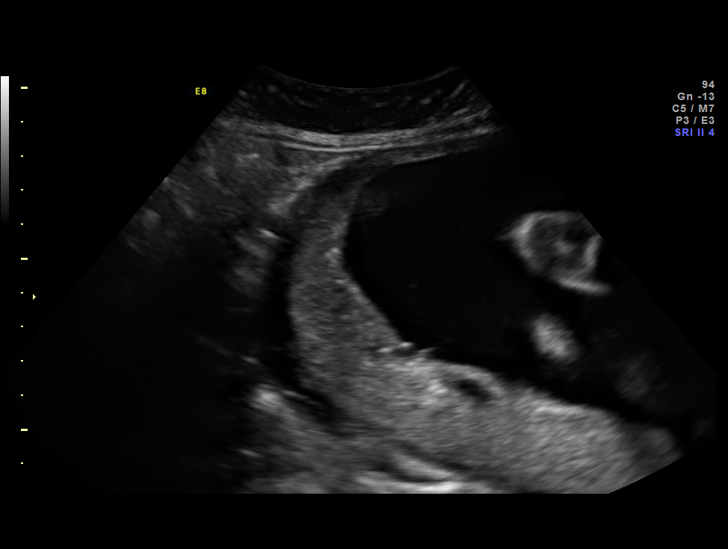
[im 7/37]
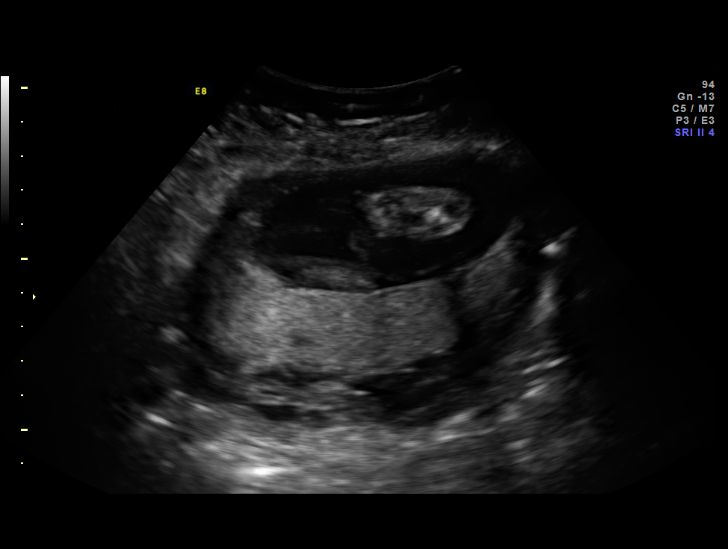
[im 11/37]
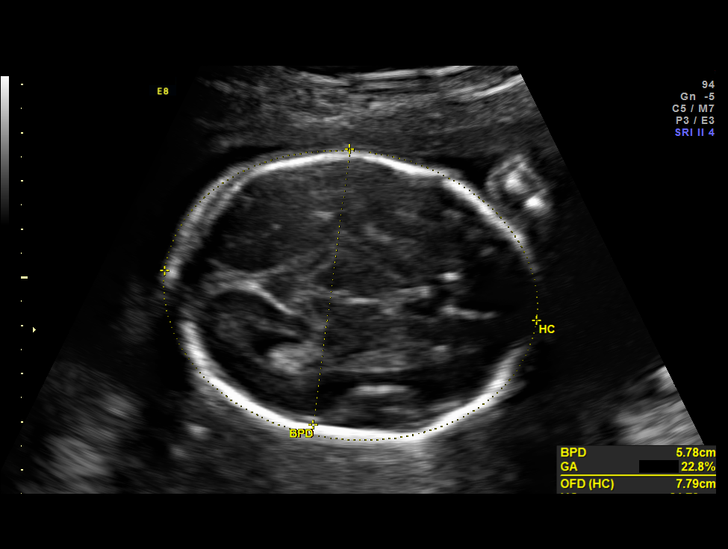
[im 14/37]
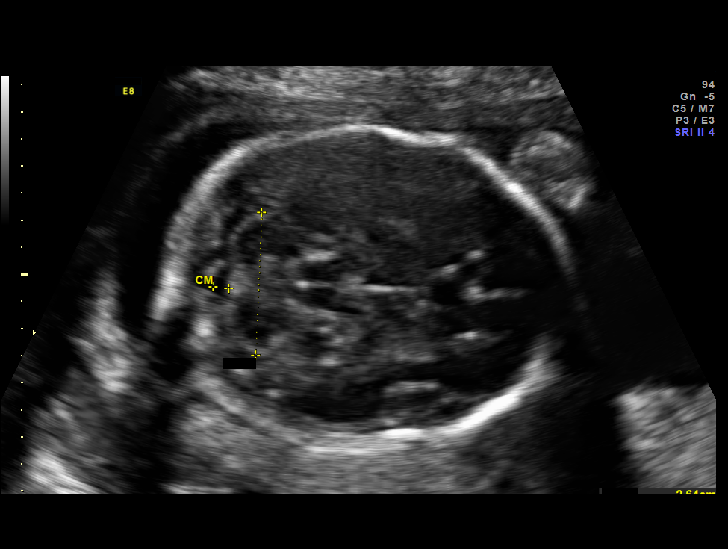
[im 17/37]
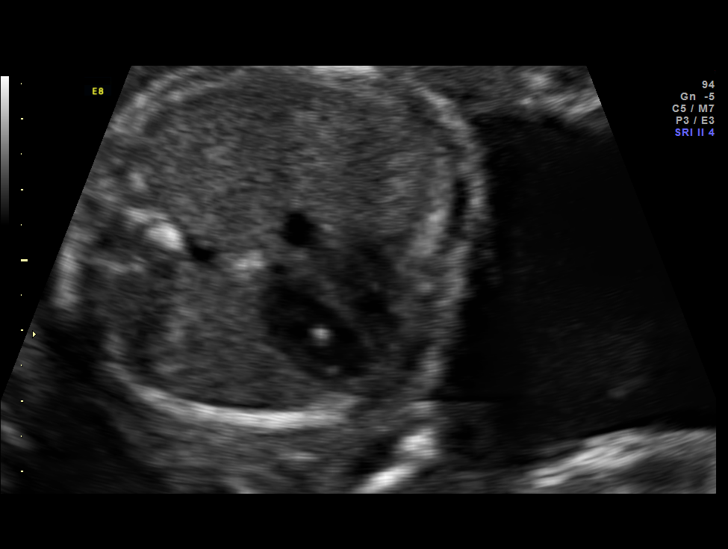
[im 21/37]
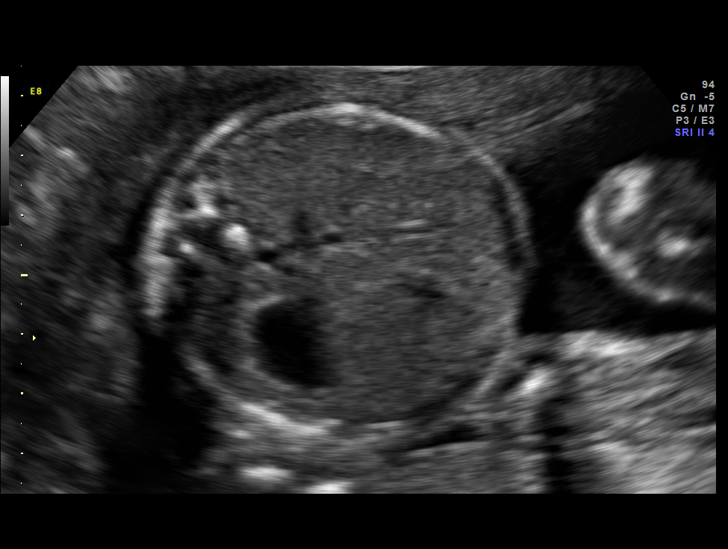
[im 23/37]
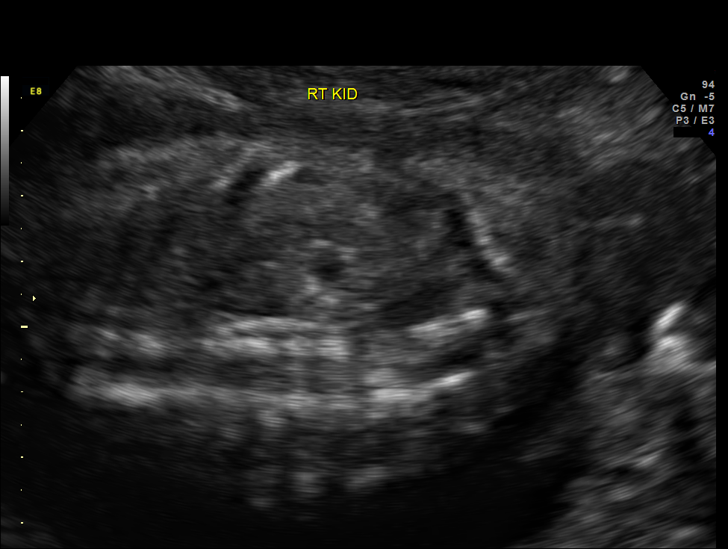
[im 26/37]
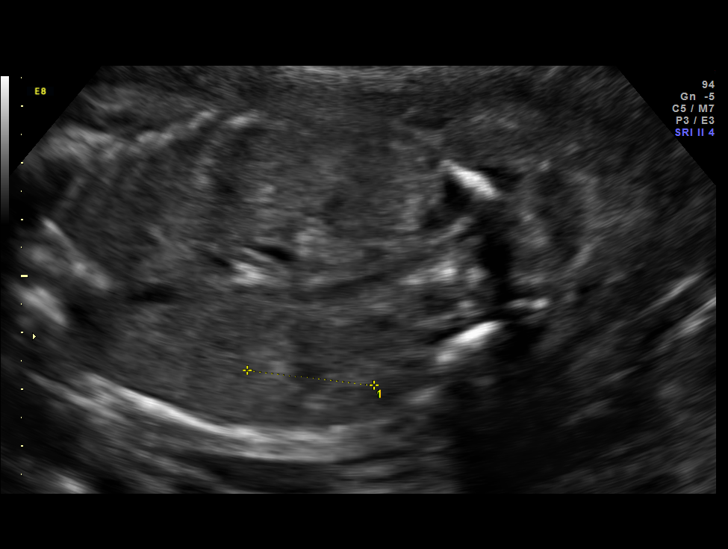
[im 30/37]
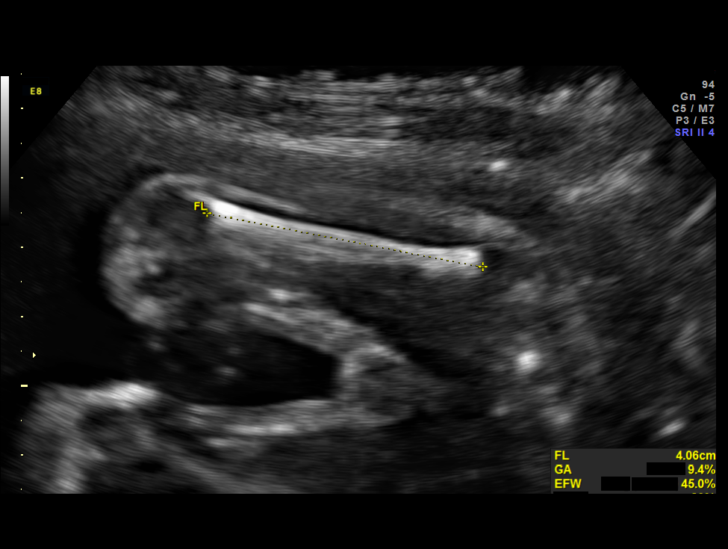
[im 33/37]
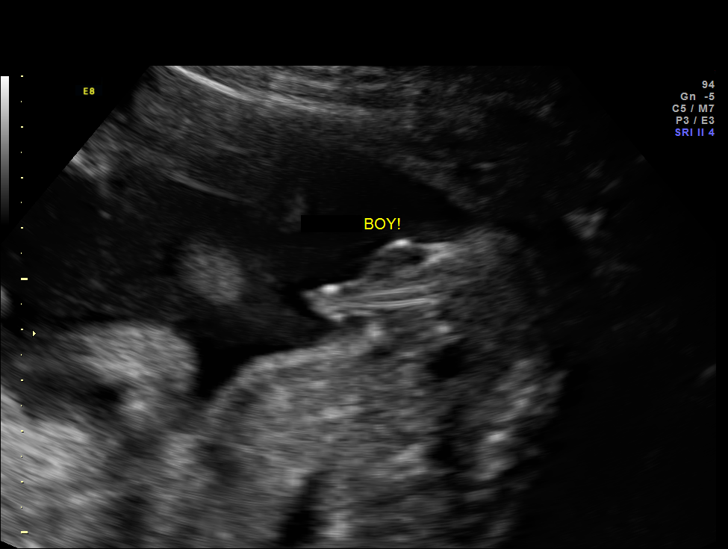
[im 35/37]
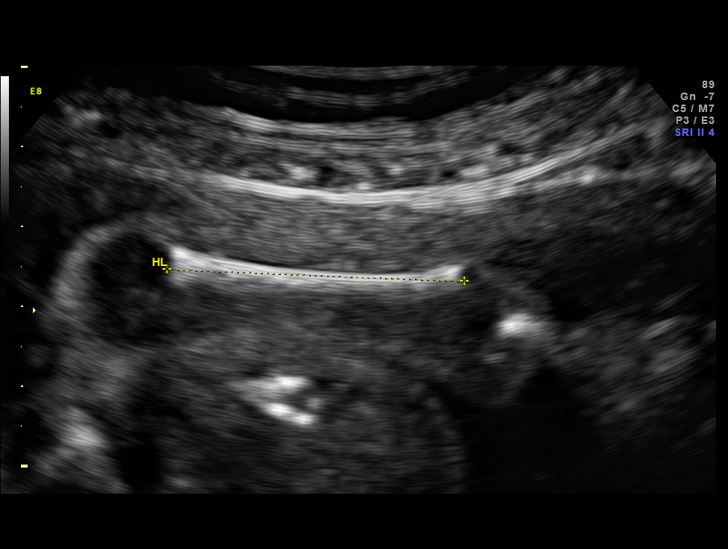

[12 of 28 positions shown; findings below may reference images not displayed]

OBSTETRICS REPORT
                      (Signed Final 08/27/2011 [DATE])

 Order#:         [id number]
Procedures

 US OB FOLLOW UP                                       76816.1
Indications

 Bilateral Choroid plexus cysts
 Echogenic focus in heart (ECF)
 FOB with congenital cardiac disease (bicuspid
 aortic valve)
 Family history of DiGeorge Syndrome
 Assess Fetal Growth / Estimated Fetal Weight
Fetal Evaluation

 Fetal Heart Rate:  163                          bpm
 Cardiac Activity:  Observed
 Presentation:      Frank breech
 Placenta:          Posterior, above cervical
                    os
 P. Cord            Previously Visualized
 Insertion:

 Amniotic Fluid
 AFI FV:      Subjectively within normal limits
                                             Larg Pckt:     6.0  cm
Biometry

 BPD:     57.6  mm     G. Age:  23w 4d                CI:         73.8   70 - 86
 OFD:     78.1  mm                                    FL/HC:      18.8   18.7 -

 HC:     217.3  mm     G. Age:  23w 5d       16  %    HC/AC:      1.09   1.05 -

 AC:     200.2  mm     G. Age:  24w 4d       53  %    FL/BPD:     70.8   71 - 87
 FL:      40.8  mm     G. Age:  23w 2d       10  %    FL/AC:      20.4   20 - 24
 HUM:     37.3  mm     G. Age:  23w 0d       13  %
 CER:     26.4  mm     G. Age:  24w 1d       46  %

 Est. FW:     649  gm      1 lb 7 oz     45  %
Gestational Age

 LMP:           24w 2d        Date:  03/10/11                 EDD:   12/15/11
 U/S Today:     23w 5d                                        EDD:   12/19/11
 Best:          24w 2d     Det. By:  LMP  (03/10/11)          EDD:   12/15/11
Anatomy

 Cranium:           Appears normal      Aortic Arch:       Previously seen
 Fetal Cavum:       Appears normal      Ductal Arch:       Not well
                                                           visualized
 Ventricles:        Appears normal      Diaphragm:         Previously seen
 Choroid Plexus:    Resolved CPC        Stomach:           Appears
                                                           normal, left
                                                           sided
 Cerebellum:        Appears normal      Abdomen:           Appears normal
 Posterior Fossa:   Appears normal      Abdominal Wall:    Previously seen
 Nuchal Fold:       Previously seen     Cord Vessels:      Previously seen
 Face:              Previously seen     Kidneys:           Appear normal
 Heart:             Echogenic           Bladder:           Appears normal
                    focus in LV
 RVOT:              Previously seen     Spine:             Previously seen
 LVOT:              Previously seen     Limbs:             Previously seen

 Other:     Fetus appears to be a male. Heels and 5th digit
            visualized. Nasal bone visualized. Technically difficult
            due to fetal position.
Targeted Anatomy

 Fetal Central Nervous System
 Cisterna Magna:
Impression

 IUP at 24 weeks 2 day
 Fetal measurements consistent with dating by LMP
 Normal amniotic fluid volume
 Interval growth is appropriate.
 Echogenic intracardiac focus is again noted.  Choroid plexus
 cysts not seen.
 Otherwise normal anatomic survey.
 Normal cell free fetal DNA testing.
Recommendations

 Recommend follow up as clinically indicated

## 2013-09-11 LAB — OB RESULTS CONSOLE RPR: RPR: NONREACTIVE

## 2013-11-07 LAB — OB RESULTS CONSOLE GBS: GBS: NEGATIVE

## 2013-12-11 ENCOUNTER — Inpatient Hospital Stay (HOSPITAL_COMMUNITY)
Admission: AD | Admit: 2013-12-11 | Discharge: 2013-12-11 | Disposition: A | Discharge status: Home / Self Care | Payer: BC Managed Care – PPO | Source: Ambulatory Visit | Attending: Obstetrics and Gynecology | Admitting: Obstetrics and Gynecology

## 2013-12-11 ENCOUNTER — Encounter (HOSPITAL_COMMUNITY): Payer: Self-pay | Admitting: *Deleted

## 2013-12-11 DIAGNOSIS — A6000 Herpesviral infection of urogenital system, unspecified: Secondary | ICD-10-CM | POA: Insufficient documentation

## 2013-12-11 DIAGNOSIS — O98519 Other viral diseases complicating pregnancy, unspecified trimester: Secondary | ICD-10-CM | POA: Insufficient documentation

## 2013-12-11 DIAGNOSIS — C4491 Basal cell carcinoma of skin, unspecified: Secondary | ICD-10-CM | POA: Diagnosis not present

## 2013-12-11 DIAGNOSIS — Z91040 Latex allergy status: Secondary | ICD-10-CM | POA: Diagnosis not present

## 2013-12-11 DIAGNOSIS — B009 Herpesviral infection, unspecified: Secondary | ICD-10-CM | POA: Diagnosis not present

## 2013-12-11 DIAGNOSIS — O479 False labor, unspecified: Secondary | ICD-10-CM | POA: Diagnosis not present

## 2013-12-11 DIAGNOSIS — O480 Post-term pregnancy: Secondary | ICD-10-CM | POA: Diagnosis not present

## 2013-12-11 DIAGNOSIS — Z8279 Family history of other congenital malformations, deformations and chromosomal abnormalities: Secondary | ICD-10-CM

## 2013-12-11 DIAGNOSIS — O34219 Maternal care for unspecified type scar from previous cesarean delivery: Secondary | ICD-10-CM | POA: Insufficient documentation

## 2013-12-11 NOTE — MAU Note (Signed)
C/o ucs for past 2 days; denies any SROM but states that she is "losing her mucous plug";

## 2013-12-11 NOTE — MAU Provider Note (Signed)
History   33 yo G2P1001 at 40 6/7 weeks presented with irregular UCs x 2 days, now around 10 min.  Patient just wanted evaluation of status and FHR assessment.  Hx previous C/S due to FTD, desires VBAC--has declined induction.  Has visit scheduled for 8/27 for ROB and BPP.  Denies leaking or bleeding, reports +FM.  No recent VE.  Patient Active Problem List   Diagnosis Date Noted  . Previous cesarean delivery, antepartum condition or complication---FTD 38/25/0539  . 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 Basal cell carcinoma 12/11/2013  . Family history of congenital heart defect--husband with bicuspid aortic valve, nephew with DiGeorges sx 12/11/2013  . Latex allergy--condoms only 12/11/2013  . Post-dates pregnancy--declines induction 12/11/2013  . Anxiety--dx 2010, no meds. 03/15/2011   History of present pregnancy: Patient entered care at 9 6/7 weeks.  Had previous consult visit with VL to discuss birth goals--very concerned that last delivery ended in C/S due to FTD due to intervention [place] of 12/05/13 was established by [place] at 12 weeks.   Anatomy scan:  19 weeks, with limited cardiac, NB, and profile findings and an anterior placenta.   Additional [place] evaluations:   Fetal echo at 20 weeks--normal cardiac views and fx Significant prenatal events:  Chose [facility] due to desire for VBAC.  Normal 1st trimester screen.  Plans to move to [facility] arose during pregnancy--initially, move was to occur prior to delivery, but patient and husband elected to defer move until after delivery.  Birth goals discussed throughout pregnancy, patient chose doula, Susa Day.  Declined flu vaccine and TDAP during pregnancy Last evaluation:  12/04/13--declined VE.  BPP scheduled 12/13/13 with ROB.   Prenatal Transfer Tool  Maternal Diabetes: No Genetic Screening: Normal 1st trimester screen Maternal Ultrasounds/Referrals: Normal Fetal Ultrasounds or other Referrals:  Fetal echo WNL (family hx of cardiac defects) Maternal  Substance Abuse:  No Significant Maternal Medications:  None Significant Maternal Lab Results: Lab values include: Group B Strep negative    Chief Complaint  Patient presents with  . Labor Eval   HPI:  See above  OB History   Grav Para Term Preterm Abortions TAB SAB Ect Mult Living   2 1 1  0 0 0 0 0 0 1    2013--Primary LTCS due to FTD, pushed x 4 hours, spinal anesthesia, 40 weeks, 24 hour labor, 8+2, female, Dr. Pamala Hurry  Past Medical History  Diagnosis Date  . Allergy   . Migraine   . Skin tag of anus     bleeding with them  . Skin cancer, basal cell     lower left back  . Anxiety     Past Surgical History  Procedure Laterality Date  . Basal cell skin cancer removed  2010    lower left back  . Cesarean section  12/17/2011    Procedure: CESAREAN SECTION;  Surgeon: Claiborne Billings A. Pamala Hurry, MD;  Location: [facility];  Service: Gynecology;  Laterality: N/A;    Family History  Problem Relation Age of Onset  . Hypertension Mother   . Diabetes Mother   . Heart disease Father     suddent death ?  about age 77   . Healthy Sister   . Hypertension Brother   . Healthy Sister   . Alcohol abuse      MGF  . Breast cancer      GPs  . Lung cancer      gp     Social Hx:  Married to FOB, [place], who  is involved and supportive.  Patient is college-educated, a SAHM, Caucasian in ethnicity.  History  Substance Use Topics  . Smoking status: Never Smoker   . Smokeless tobacco: Not on file  . Alcohol Use: Yes    Allergies:  Allergies  Allergen Reactions  . Moxifloxacin     REACTION: difficulty breathing    No prescriptions prior to admission    ROS:  Sporadic contractions, +FM Physical Exam   Blood pressure 115/84, pulse 95, temperature 98.5 F (36.9 C), temperature source Oral, resp. rate 18, height 5\' 2"  (1.575 m), weight 133 lb (60.328 kg), currently breastfeeding.  Filed Vitals:   12/11/13 2058 12/11/13 2151  BP: 116/86 115/84  Pulse: 114 95  Temp: 98.5 F (36.9  C)   TempSrc: Oral   Resp: 18 18  Height: 5\' 2"  (1.575 m)   Weight: 133 lb (60.328 kg)      Physical Exam Chest clear Heart RRR without murmur Abd gravid, NT Pelvic--cervix posterior, 2-3 cm, 80%, vtx, -2, IBOW Ext WNL  FHR Category 1 UCs irregular, q 5-8 min, varying quality mild/moderate  Prenatal Labs: O+ Antibody neg RPR NR Rubella Immune HIV NR HBSAG Negative GBS negative Urine culture negative Pap WNL 04/2013 Cultures negative 04/17/13 Toxo titers negative 04/07/13 Glucola WNL    ED Course  Assessment: IUP at 40 6/7 weeks Prodromal labor Previous cesarean, plans VBAC Declines induction GBS negative  Plan: D/C home to await increased labor Monitor [facility] Call with increase in quality/frequency of UCs, SROM, etc. Keep scheduled appt at [facility] on Thursday for ROB and BPP.   Donnel Saxon CNM, MSN 12/11/2013 10:17 PM

## 2013-12-11 NOTE — Discharge Instructions (Signed)
Braxton Hicks Contractions °Contractions of the uterus can occur throughout pregnancy. Contractions are not always a sign that you are in labor.  °WHAT ARE BRAXTON HICKS CONTRACTIONS?  °Contractions that occur before labor are called Braxton Hicks contractions, or false labor. Toward the end of pregnancy (32-34 weeks), these contractions can develop more often and may become more forceful. This is not true labor because these contractions do not result in opening (dilatation) and thinning of the cervix. They are sometimes difficult to tell apart from true labor because these contractions can be forceful and people have different pain tolerances. You should not feel embarrassed if you go to the hospital with false labor. Sometimes, the only way to tell if you are in true labor is for your health care provider to look for changes in the cervix. °If there are no prenatal problems or other health problems associated with the pregnancy, it is completely safe to be sent home with false labor and await the onset of true labor. °HOW CAN YOU TELL THE DIFFERENCE BETWEEN TRUE AND FALSE LABOR? °False Labor °· The contractions of false labor are usually shorter and not as hard as those of true labor.   °· The contractions are usually irregular.   °· The contractions are often felt in the front of the lower abdomen and in the groin.   °· The contractions may go away when you walk around or change positions while lying down.   °· The contractions get weaker and are shorter lasting as time goes on.   °· The contractions do not usually become progressively stronger, regular, and closer together as with true labor.   °True Labor °· Contractions in true labor last 30-70 seconds, become very regular, usually become more intense, and increase in frequency.   °· The contractions do not go away with walking.   °· The discomfort is usually felt in the top of the uterus and spreads to the lower abdomen and low back.   °· True labor can be  determined by your health care provider with an exam. This will show that the cervix is dilating and getting thinner.   °WHAT TO REMEMBER °· Keep up with your usual exercises and follow other instructions given by your health care provider.   °· Take medicines as directed by your health care provider.   °· Keep your regular prenatal appointments.   °· Eat and drink lightly if you think you are going into labor.   °· If Braxton Hicks contractions are making you uncomfortable:   °¨ Change your position from lying down or resting to walking, or from walking to resting.   °¨ Sit and rest in a tub of warm water.   °¨ Drink 2-3 glasses of water. Dehydration may cause these contractions.   °¨ Do slow and deep breathing several times an hour.   °WHEN SHOULD I SEEK IMMEDIATE MEDICAL CARE? °Seek immediate medical care if: °· Your contractions become stronger, more regular, and closer together.   °· You have fluid leaking or gushing from your vagina.   °· You have a fever.   °· You pass blood-tinged mucus.   °· You have vaginal bleeding.   °· You have continuous abdominal pain.   °· You have low back pain that you never had before.   °· You feel your baby's head pushing down and causing pelvic pressure.   °· Your baby is not moving as much as it used to.   °Document Released: 04/05/2005 Document Revised: 04/10/2013 Document Reviewed: 01/15/2013 °[employer]® Patient Information ©2015 [employer]. This information is not intended to replace advice given to you by your health care   provider. Make sure you discuss any questions you have with your health care provider. ° °

## 2013-12-12 ENCOUNTER — Encounter (HOSPITAL_COMMUNITY): Payer: Self-pay | Admitting: *Deleted

## 2013-12-12 ENCOUNTER — Inpatient Hospital Stay (HOSPITAL_COMMUNITY)
Admission: AD | Admit: 2013-12-12 | Discharge: 2013-12-13 | DRG: 774 | Disposition: A | Discharge status: Home / Self Care | Payer: BC Managed Care – PPO | Source: Ambulatory Visit | Attending: Obstetrics and Gynecology | Admitting: Obstetrics and Gynecology

## 2013-12-12 DIAGNOSIS — Z85828 Personal history of other malignant neoplasm of skin: Secondary | ICD-10-CM | POA: Diagnosis not present

## 2013-12-12 DIAGNOSIS — O480 Post-term pregnancy: Secondary | ICD-10-CM | POA: Diagnosis present

## 2013-12-12 DIAGNOSIS — A6000 Herpesviral infection of urogenital system, unspecified: Secondary | ICD-10-CM | POA: Diagnosis present

## 2013-12-12 DIAGNOSIS — Z803 Family history of malignant neoplasm of breast: Secondary | ICD-10-CM | POA: Diagnosis not present

## 2013-12-12 DIAGNOSIS — Z833 Family history of diabetes mellitus: Secondary | ICD-10-CM | POA: Diagnosis not present

## 2013-12-12 DIAGNOSIS — Z801 Family history of malignant neoplasm of trachea, bronchus and lung: Secondary | ICD-10-CM

## 2013-12-12 DIAGNOSIS — O98519 Other viral diseases complicating pregnancy, unspecified trimester: Secondary | ICD-10-CM | POA: Diagnosis present

## 2013-12-12 DIAGNOSIS — Z91040 Latex allergy status: Secondary | ICD-10-CM

## 2013-12-12 DIAGNOSIS — O479 False labor, unspecified: Secondary | ICD-10-CM | POA: Diagnosis present

## 2013-12-12 DIAGNOSIS — Z8249 Family history of ischemic heart disease and other diseases of the circulatory system: Secondary | ICD-10-CM | POA: Diagnosis not present

## 2013-12-12 DIAGNOSIS — O34219 Maternal care for unspecified type scar from previous cesarean delivery: Secondary | ICD-10-CM | POA: Diagnosis present

## 2013-12-12 LAB — CBC
HEMATOCRIT: 40.1 % (ref 36.0–46.0)
HEMOGLOBIN: 14.2 g/dL (ref 12.0–15.0)
MCH: 34.2 pg — AB (ref 26.0–34.0)
MCHC: 35.4 g/dL (ref 30.0–36.0)
MCV: 96.6 fL (ref 78.0–100.0)
Platelets: 196 10*3/uL (ref 150–400)
RBC: 4.15 MIL/uL (ref 3.87–5.11)
RDW: 13.8 % (ref 11.5–15.5)
WBC: 15 10*3/uL — AB (ref 4.0–10.5)

## 2013-12-12 LAB — COMPREHENSIVE METABOLIC PANEL
ALK PHOS: 106 U/L (ref 39–117)
ALT: 32 U/L (ref 0–35)
ANION GAP: 14 (ref 5–15)
AST: 22 U/L (ref 0–37)
Albumin: 3 g/dL — ABNORMAL LOW (ref 3.5–5.2)
BILIRUBIN TOTAL: 0.3 mg/dL (ref 0.3–1.2)
BUN: 7 mg/dL (ref 6–23)
CO2: 20 meq/L (ref 19–32)
Calcium: 8.4 mg/dL (ref 8.4–10.5)
Chloride: 103 mEq/L (ref 96–112)
Creatinine, Ser: 0.33 mg/dL — ABNORMAL LOW (ref 0.50–1.10)
GFR calc Af Amer: >90 mL/min (ref >90)
Glucose, Bld: 103 mg/dL — ABNORMAL HIGH (ref 70–99)
POTASSIUM: 4.2 meq/L (ref 3.7–5.3)
Sodium: 137 mEq/L (ref 137–147)
Total Protein: 5.8 g/dL — ABNORMAL LOW (ref 6.0–8.3)

## 2013-12-12 LAB — URIC ACID: Uric Acid, Serum: 4.6 mg/dL (ref 2.4–7.0)

## 2013-12-12 LAB — TYPE AND SCREEN
ABO/RH(D): O POS
Antibody Screen: NEGATIVE

## 2013-12-12 LAB — ABO/RH: ABO/RH(D): O POS

## 2013-12-12 LAB — RPR

## 2013-12-12 LAB — LACTATE DEHYDROGENASE: LDH: 188 U/L (ref 94–250)

## 2013-12-12 MED ORDER — SENNOSIDES-DOCUSATE SODIUM 8.6-50 MG PO TABS
2.0000 | ORAL_TABLET | ORAL | Status: DC
  Administered 2013-12-13: 2 via ORAL
  Filled 2013-12-12: qty 2

## 2013-12-12 MED ORDER — NALBUPHINE HCL 10 MG/ML IJ SOLN
5.0000 mg | INTRAMUSCULAR | Status: DC | PRN
  Administered 2013-12-12 (×2): 5 mg via INTRAVENOUS
  Filled 2013-12-12 (×2): qty 1

## 2013-12-12 MED ORDER — LANOLIN HYDROUS EX OINT: TOPICAL_OINTMENT | CUTANEOUS | Status: DC | PRN

## 2013-12-12 MED ORDER — FERROUS SULFATE 325 (65 FE) MG PO TABS
325.0000 mg | ORAL_TABLET | Freq: Two times a day (BID) | ORAL | Status: DC
Start: 2013-12-12 — End: 2013-12-13
  Administered 2013-12-12 – 2013-12-13 (×2): 325 mg via ORAL
  Filled 2013-12-12 (×4): qty 1

## 2013-12-12 MED ORDER — ACETAMINOPHEN 325 MG PO TABS: 650.0000 mg | ORAL_TABLET | ORAL | Status: DC | PRN

## 2013-12-12 MED ORDER — IBUPROFEN 600 MG PO TABS
600.0000 mg | ORAL_TABLET | Freq: Four times a day (QID) | ORAL | Status: DC
  Administered 2013-12-12 – 2013-12-13 (×5): 600 mg via ORAL
  Filled 2013-12-12 (×5): qty 1

## 2013-12-12 MED ORDER — OXYCODONE-ACETAMINOPHEN 5-325 MG PO TABS: 1.0000 | ORAL_TABLET | ORAL | Status: DC | PRN

## 2013-12-12 MED ORDER — LACTATED RINGERS IV SOLN: 500.0000 mL | INTRAVENOUS | Status: DC | PRN

## 2013-12-12 MED ORDER — ONDANSETRON HCL 4 MG/2ML IJ SOLN: 4.0000 mg | INTRAMUSCULAR | Status: DC | PRN

## 2013-12-12 MED ORDER — ONDANSETRON HCL 4 MG/2ML IJ SOLN
4.0000 mg | Freq: Four times a day (QID) | INTRAMUSCULAR | Status: DC | PRN
Start: 2013-12-12 — End: 2013-12-12

## 2013-12-12 MED ORDER — ZOLPIDEM TARTRATE 5 MG PO TABS: 5.0000 mg | ORAL_TABLET | Freq: Every evening | ORAL | Status: DC | PRN

## 2013-12-12 MED ORDER — OXYTOCIN 40 UNITS IN LACTATED RINGERS INFUSION - SIMPLE MED
62.5000 mL/h | INTRAVENOUS | Status: DC
  Administered 2013-12-12: 999 mL/h via INTRAVENOUS
  Filled 2013-12-12: qty 1000

## 2013-12-12 MED ORDER — BENZOCAINE-MENTHOL 20-0.5 % EX AERO
1.0000 | INHALATION_SPRAY | CUTANEOUS | Status: DC | PRN
Start: 2013-12-12 — End: 2013-12-13
  Administered 2013-12-12: 1 via TOPICAL
  Filled 2013-12-12 (×2): qty 56

## 2013-12-12 MED ORDER — PRENATAL MULTIVITAMIN CH
1.0000 | ORAL_TABLET | Freq: Every day | ORAL | Status: DC
  Administered 2013-12-13: 1 via ORAL
  Filled 2013-12-12: qty 1

## 2013-12-12 MED ORDER — LIDOCAINE HCL (PF) 1 % IJ SOLN
30.0000 mL | INTRAMUSCULAR | Status: DC | PRN
  Administered 2013-12-12: 30 mL via SUBCUTANEOUS
  Filled 2013-12-12: qty 30

## 2013-12-12 MED ORDER — DIPHENHYDRAMINE HCL 25 MG PO CAPS: 25.0000 mg | ORAL_CAPSULE | Freq: Four times a day (QID) | ORAL | Status: DC | PRN

## 2013-12-12 MED ORDER — SIMETHICONE 80 MG PO CHEW
80.0000 mg | CHEWABLE_TABLET | ORAL | Status: DC | PRN
  Filled 2013-12-12: qty 1

## 2013-12-12 MED ORDER — MISOPROSTOL 200 MCG PO TABS
1000.0000 ug | ORAL_TABLET | Freq: Once | ORAL | Status: AC
  Administered 2013-12-12: 1000 ug via RECTAL

## 2013-12-12 MED ORDER — ONDANSETRON HCL 4 MG PO TABS: 4.0000 mg | ORAL_TABLET | ORAL | Status: DC | PRN

## 2013-12-12 MED ORDER — OXYTOCIN BOLUS FROM INFUSION: 500.0000 mL | INTRAVENOUS | Status: DC

## 2013-12-12 MED ORDER — LACTATED RINGERS IV SOLN
INTRAVENOUS | Status: DC
  Administered 2013-12-12: 07:00:00 via INTRAVENOUS

## 2013-12-12 MED ORDER — MISOPROSTOL 200 MCG PO TABS
ORAL_TABLET | ORAL | Status: AC
  Filled 2013-12-12: qty 5

## 2013-12-12 MED ORDER — FLEET ENEMA 7-19 GM/118ML RE ENEM: 1.0000 | ENEMA | RECTAL | Status: DC | PRN

## 2013-12-12 MED ORDER — SODIUM CHLORIDE 0.9 % IJ SOLN: 3.0000 mL | Freq: Two times a day (BID) | INTRAMUSCULAR | Status: DC

## 2013-12-12 MED ORDER — CITRIC ACID-SODIUM CITRATE 334-500 MG/5ML PO SOLN: 30.0000 mL | ORAL | Status: DC | PRN

## 2013-12-12 MED ORDER — IBUPROFEN 600 MG PO TABS: 600.0000 mg | ORAL_TABLET | Freq: Four times a day (QID) | ORAL | Status: DC | PRN

## 2013-12-12 MED ORDER — DOCUSATE SODIUM 100 MG PO CAPS
100.0000 mg | ORAL_CAPSULE | Freq: Two times a day (BID) | ORAL | Status: DC
  Administered 2013-12-12 – 2013-12-13 (×2): 100 mg via ORAL
  Filled 2013-12-12 (×2): qty 1

## 2013-12-12 MED ORDER — TETANUS-DIPHTH-ACELL PERTUSSIS 5-2.5-18.5 LF-MCG/0.5 IM SUSP
0.5000 mL | Freq: Once | INTRAMUSCULAR | Status: DC
Start: 2013-12-13 — End: 2013-12-13
  Filled 2013-12-12: qty 0.5

## 2013-12-12 MED ORDER — SODIUM CHLORIDE 0.9 % IJ SOLN: 3.0000 mL | INTRAMUSCULAR | Status: DC | PRN

## 2013-12-12 MED ORDER — DIBUCAINE 1 % RE OINT
1.0000 "application " | TOPICAL_OINTMENT | RECTAL | Status: DC | PRN
  Filled 2013-12-12: qty 28

## 2013-12-12 MED ORDER — SODIUM CHLORIDE 0.9 % IV SOLN: 250.0000 mL | INTRAVENOUS | Status: DC | PRN

## 2013-12-12 MED ORDER — WITCH HAZEL-GLYCERIN EX PADS
1.0000 "application " | MEDICATED_PAD | CUTANEOUS | Status: DC | PRN
  Administered 2013-12-12: 1 via TOPICAL

## 2013-12-12 NOTE — H&P (Signed)
33 yo G2P1001 at 40 6/7 weeks presented with contractions of increasing intensity and frequency since leaving MAU around 10:30p.  Denies leaking, has had some bloody show.  She has been contracting for approx 2 days, with cycles of regular and irregular contractions.  Report +FM.  Hx previous C/S due to FTD, desires VBAC--has declined induction.   Patient Active Problem List    Diagnosis  Date Noted   .  Previous cesarean delivery, antepartum condition or complication---FTD  54/62/7035   .  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  Basal cell carcinoma  12/11/2013   .  Family history of congenital heart defect--husband with bicuspid aortic valve, nephew with DiGeorges sx  12/11/2013   .  Latex allergy--condoms only  12/11/2013   .  Post-dates pregnancy--declines induction  12/11/2013   .  Anxiety--dx 2010, no meds.  03/15/2011    History of present pregnancy:  Patient entered care at 9 6/7 weeks. Had previous consult visit with VL to discuss birth goals--very concerned that last delivery ended in C/S due to FTD due to intervention  [place] of 12/05/13 was established by [place] at 12 weeks.  Anatomy scan: 19 weeks, with limited cardiac, NB, and profile findings and an anterior placenta.  Additional [place] evaluations:  Fetal echo at 20 weeks--normal cardiac views and fx  Significant prenatal events: Chose [facility] due to desire for VBAC. Normal 1st trimester screen. Plans to move to [facility] arose during pregnancy--initially, move was to occur prior to delivery, but patient and husband elected to defer move until after delivery. Birth goals discussed throughout pregnancy, patient chose doula, Susa Day. Declined flu vaccine and TDAP during pregnancy  Last evaluation: 12/04/13--declined VE. BPP scheduled 12/13/13 with [facility].   Prenatal Transfer Tool   Maternal Diabetes: No  Genetic Screening: Normal 1st trimester screen  Maternal Ultrasounds/Referrals: Normal  Fetal Ultrasounds or other Referrals: Fetal echo WNL  (family hx of cardiac defects)  Maternal Substance Abuse: No  Significant Maternal Medications: None  Significant Maternal Lab Results: Lab values include: Group B Strep negative   Chief Complaint   Patient presents with   .  Labor Eval    HPI: See above  OB History    Grav  Para  Term  Preterm  Abortions  TAB  SAB  Ect  Mult  Living    2  1  1   0  0  0  0  0  0  1     2013--Primary LTCS due to FTD, pushed x 4 hours, spinal anesthesia, 40 weeks, 24 hour labor, 8+2, female, Dr. Pamala Hurry, 2 layer closure.  Past Medical History   Diagnosis  Date   .  Allergy    .  Migraine    .  Skin tag of anus      bleeding with them   .  Skin cancer, basal cell      lower left back   .  Anxiety       Past Surgical History   Procedure  Laterality  Date   .  Basal cell skin cancer removed   2010     lower left back   .  Cesarean section   12/17/2011     Procedure: CESAREAN SECTION; Surgeon: Claiborne Billings A. Pamala Hurry, MD; Location: [facility]; Service: Gynecology; Laterality: N/A;      Family History   Problem  Relation  Age of Onset   .  Hypertension  Mother    .  Diabetes  Mother    .  Heart disease  Father      suddent death ? about age 46   .  Healthy  Sister    .  Hypertension  Brother    .  Healthy  Sister    .  Alcohol abuse       MGF   .  Breast cancer       GPs   .  Lung cancer       gp    Social Hx: Married to FOB, Rachel Bo, who is involved and supportive. Patient is college-educated, a SAHM, Caucasian in ethnicity.   History   Substance Use Topics   .  Smoking status:  Never Smoker   .  Smokeless tobacco:  Not on file   .  Alcohol Use:  Yes    Allergies:  Allergies   Allergen  Reactions   .  Moxifloxacin      REACTION: difficulty breathing    No prescriptions prior to admission    ROS: Regular contractions, +FM, bloody show.  Physical Exam    Filed Vitals:   12/12/13 0408  BP: 126/87  Pulse: 106  Temp: 97.8 F (36.6 C)  TempSrc: Oral  Resp: 18     Physical  Exam  Chest clear  Heart RRR without murmur  Abd gravid, NT  Pelvic--cervix posterior, 4 cm, 80%, vtx, -2, IBOW  Ext WNL   FHR Category 1  UCs q 3 min, moderate    Prenatal Labs:  O+  Antibody neg  RPR NR  Rubella Immune  HIV NR  HBSAG Negative  GBS negative  Urine culture negative  Pap WNL 04/2013  Cultures negative 04/17/13  Toxo titers negative 04/07/13  Glucola WNL   ED Course   Assessment:  IUP at 40 6/7 weeks  Early labor Previous cesarean, plans VBAC  GBS negative  Desires non-interventive labor.  Plan:  Admit to [facility] per consult with Dr. Simona Huh. Routine [facility] orders Continuous monitoring due to [facility] Birth plan reviewed. May want IV Nubain--order entered. OK to ambulate with telemetry monitoring, use of tub in bathroom Saline lock R&B of VBAC reviewed with patient and husband, including failure of trial of labor, need for repeat C/S, uterine rupture, fetal compromise, and need for further intervention.  Patient and husband seem to understand these risks and wish to proceed with TOL. VBAC consent signed and placed in EPIC shadow chart.  Donnel Saxon CNM, MSN  12/11/2013 10:17 PM

## 2013-12-12 NOTE — MAU Note (Signed)
VBAC

## 2013-12-12 NOTE — Progress Notes (Signed)
Boette at bedside.  Assisting with breastfeeding.

## 2013-12-12 NOTE — Progress Notes (Addendum)
  Subjective: Walking in room, breathing with UCs.  Using Rebozo for support.  [name] [name], and patient's husband present and supportive. May want Nubain IV.  Objective: BP 130/72  Pulse 103  Temp(Src) 97.8 F (36.6 C) (Oral)  Resp 18  Ht 5\' 2"  (1.575 m)  Wt 134 lb (60.782 kg)  BMI 24.50 kg/m2      FHT: Category 1 on intermittent monitoring UC:   regular, every 3 minutes SVE:  Deferred at present   Assessment:  IUP at 41 weeks Previous C/S, working for VBAC GBS negative  Plan: Nubain IV if desired. Will continue to observe at present.  Donnel Saxon CNM 12/12/2013, 6:31 AM

## 2013-12-13 LAB — CBC
HCT: 30.5 % — ABNORMAL LOW (ref 36.0–46.0)
HEMOGLOBIN: 10.7 g/dL — AB (ref 12.0–15.0)
MCH: 33.8 pg (ref 26.0–34.0)
MCHC: 35.1 g/dL (ref 30.0–36.0)
MCV: 96.2 fL (ref 78.0–100.0)
Platelets: 187 10*3/uL (ref 150–400)
RBC: 3.17 MIL/uL — ABNORMAL LOW (ref 3.87–5.11)
RDW: 13.9 % (ref 11.5–15.5)
WBC: 21.9 10*3/uL — ABNORMAL HIGH (ref 4.0–10.5)

## 2013-12-13 MED ORDER — HYDROCODONE-ACETAMINOPHEN 5-325 MG PO TABS: 1.0000 | ORAL_TABLET | Freq: Four times a day (QID) | ORAL | Status: AC | PRN

## 2013-12-13 MED ORDER — IBUPROFEN 600 MG PO TABS: 600.0000 mg | ORAL_TABLET | Freq: Four times a day (QID) | ORAL | Status: AC | PRN

## 2013-12-13 NOTE — Discharge Summary (Signed)
Vaginal Delivery Discharge Summary  [name] [name]  DOB:    1980/12/01 MRN:    [id number] CSN:    [id number]  Date of admission:                  12/12/13  Date of discharge:                   12/13/13  Procedures this admission:   VBAC  Date of Delivery: 12/12/13  Newborn Data:  Live born female  Birth Weight: 8 lb 5.7 oz (3790 g) APGAR: 8, 9  Home with mother. Name: [name] [name]   History of Present Illness:  Ms. [name] [name] is a 33 y.o. female, G2P2002, who presents at [redacted]w[redacted]d weeks gestation. The patient has been followed at the [facility] division of [facility]. She was admitted onset of labor. Her pregnancy has been complicated by:  Patient Active Problem List   Diagnosis Date Noted  . VBAC (vaginal birth after Cesarean) 12/12/2013  . Previous cesarean delivery, antepartum condition or complication---FTD 67/03/4579  . 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 Basal cell carcinoma 12/11/2013  . Family history of congenital heart defect--husband with bicuspid aortic valve, nephew with DiGeorges sx 12/11/2013  . Latex allergy--condoms only 12/11/2013  . Post-dates pregnancy--declines induction 12/11/2013  . Anxiety--dx 2010, no meds. 03/15/2011     Hospital Course:  Admitted 12/12/13 in active labor. Negative GBS. Progressed  Spontaneously.  Utilized Nubain for pain management.  Delivery was performed by Venus Standard, CNM, after 2 hours 4 min of pushing, without complication. Patient and baby tolerated the procedure without difficulty, with 2nd degree perineal laceration and left labial laceration noted. Infant status was stable and remained in room with mother.  Mother and infant then had an uncomplicated postpartum course, with breast feeding going well. Mom's physical exam was WNL, and she was discharged home in stable condition. Contraception plan was undecided at present. She received adequate benefit from po pain  medications. She will be moving to [place] with her family on 8/31--she will make pp f/u appt there.   Feeding:  breast  Contraception:  Undecided at present  Discharge hemoglobin:  Hemoglobin  Date Value Ref Range Status  12/13/2013 10.7* 12.0 - 15.0 g/dL Final     REPEATED TO VERIFY     DELTA CHECK NOTED     HCT  Date Value Ref Range Status  12/13/2013 30.5* 36.0 - 46.0 % Final    Discharge Physical Exam:   General: alert Lochia: appropriate Uterine Fundus: firm Incision: healing well DVT Evaluation: No evidence of DVT seen on physical exam. Negative Homan's sign.  Intrapartum Procedures: spontaneous vaginal delivery Postpartum Procedures: none Complications-Operative and Postpartum: 2nd degree perineal laceration and left labial  Discharge Diagnoses: Term Pregnancy-delivered and VBAC  Discharge Information:  Activity:           pelvic rest Diet:                routine Medications: Ibuprofen and Vicodin Condition:      stable Instructions:     Discharge to: home  Follow-up Information   Follow up with [facility]. Schedule an appointment as soon as possible for a visit in 6 weeks. (Will f/u with practice in [place] after relocation.  Call [facility] for any questions as needed.)    Specialty:  Obstetrics and Gynecology   Contact information:   [address] [place]. [place] [id number] [phone]  Donnel Saxon CNM 12/13/2013 8:25 AM

## 2013-12-13 NOTE — Discharge Instructions (Signed)
Postpartum Care After Vaginal Delivery °After you deliver your newborn (postpartum period), the usual stay in the hospital is 24-72 hours. If there were problems with your labor or delivery, or if you have other medical problems, you might be in the hospital longer.  °While you are in the hospital, you will receive help and instructions on how to care for yourself and your newborn during the postpartum period.  °While you are in the hospital: °· Be sure to tell your nurses if you have pain or discomfort, as well as where you feel the pain and what makes the pain worse. °· If you had an incision made near your vagina (episiotomy) or if you had some tearing during delivery, the nurses may put ice packs on your episiotomy or tear. The ice packs may help to reduce the pain and swelling. °· If you are breastfeeding, you may feel uncomfortable contractions of your uterus for a couple of weeks. This is normal. The contractions help your uterus get back to normal size. °· It is normal to have some bleeding after delivery. °¨ For the first 1-3 days after delivery, the flow is red and the amount may be similar to a period. °¨ It is common for the flow to start and stop. °¨ In the first few days, you may pass some small clots. Let your nurses know if you begin to pass large clots or your flow increases. °¨ Do not  flush blood clots down the toilet before having the nurse look at them. °¨ During the next 3-10 days after delivery, your flow should become more watery and pink or brown-tinged in color. °¨ Ten to fourteen days after delivery, your flow should be a small amount of yellowish-white discharge. °¨ The amount of your flow will decrease over the first few weeks after delivery. Your flow may stop in 6-8 weeks. Most women have had their flow stop by 12 weeks after delivery. °· You should change your sanitary pads frequently. °· Wash your hands thoroughly with soap and water for at least 20 seconds after changing pads, using  the toilet, or before holding or feeding your newborn. °· You should feel like you need to empty your bladder within the first 6-8 hours after delivery. °· In case you become weak, lightheaded, or faint, call your nurse before you get out of bed for the first time and before you take a shower for the first time. °· Within the first few days after delivery, your breasts may begin to feel tender and full. This is called engorgement. Breast tenderness usually goes away within 48-72 hours after engorgement occurs. You may also notice milk leaking from your breasts. If you are not breastfeeding, do not stimulate your breasts. Breast stimulation can make your breasts produce more milk. °· Spending as much time as possible with your newborn is very important. During this time, you and your newborn can feel close and get to know each other. Having your newborn stay in your room (rooming in) will help to strengthen the bond with your newborn.  It will give you time to get to know your newborn and become comfortable caring for your newborn. °· Your hormones change after delivery. Sometimes the hormone changes can temporarily cause you to feel sad or tearful. These feelings should not last more than a few days. If these feelings last longer than that, you should talk to your caregiver. °· If desired, talk to your caregiver about methods of family planning or contraception. °·   Talk to your caregiver about immunizations. Your caregiver may want you to have the following immunizations before leaving the hospital:  Tetanus, diphtheria, and pertussis (Tdap) or tetanus and diphtheria (Td) immunization. It is very important that you and your family (including grandparents) or others caring for your newborn are up-to-date with the Tdap or Td immunizations. The Tdap or Td immunization can help protect your newborn from getting ill.  Rubella immunization.  Varicella (chickenpox) immunization.  Influenza immunization. You should  receive this annual immunization if you did not receive the immunization during your pregnancy. Document Released: 01/31/2007 Document Revised: 12/29/2011 Document Reviewed: 12/01/2011 [facility] Patient Information 2015 [place], [place]. This information is not intended to replace advice given to you by your health care provider. Make sure you discuss any questions you have with your health care provider.  Vaginal Birth After Cesarean Delivery Vaginal birth after cesarean delivery (VBAC) is giving birth vaginally after previously delivering a baby by a cesarean. In the past, if a woman had a cesarean delivery, all births afterward would be done by cesarean delivery. This is no longer true. It can be safe for the mother to try a vaginal delivery after having a cesarean delivery.  It is important to discuss VBAC with your health care provider early in the pregnancy so you can understand the risks, benefits, and options. It will give you time to decide what is best in your particular case. The final decision about whether to have a VBAC or repeat cesarean delivery should be between you and your health care provider. Any changes in your health or your baby's health during your pregnancy may make it necessary to change your initial decision about VBAC.  WOMEN WHO PLAN TO HAVE A VBAC SHOULD CHECK WITH THEIR HEALTH CARE PROVIDER TO BE SURE THAT:  The previous cesarean delivery was done with a low transverse uterine cut (incision) (not a vertical classical incision).   The birth canal is big enough for the baby.   There were no other operations on the uterus.   An electronic fetal monitor (EFM) will be on at all times during labor.   An operating room will be available and ready in case an emergency cesarean delivery is needed.   A health care provider and surgical nursing staff will be available at all times during labor to be ready to do an emergency delivery cesarean if necessary.   An  anesthesiologist will be present in case an emergency cesarean delivery is needed.   The nursery is prepared and has adequate personnel and necessary equipment available to care for the baby in case of an emergency cesarean delivery. BENEFITS OF VBAC  Shorter stay in the hospital.   Avoidance of risks associated with cesarean delivery, such as:  Surgical complications, such as opening of the incision or hernia in the incision.  Injury to other organs.  Fever. This can occur if an infection develops after surgery. It can also occur as a reaction to the medicine given to make you numb during the surgery.  Less blood loss and need for blood transfusions.  Lower risk of blood clots and infection.  Shorter recovery.   Decreased risk for having to remove the uterus (hysterectomy).   Decreased risk for the placenta to completely or partially cover the opening of the uterus (placenta previa) with a future pregnancy.   Decrease risk in future labor and delivery. RISKS OF A VBAC  Tearing (rupture) of the uterus. This is occurs in less than  1% of VBACs. The risk of this happening is higher if:  Steps are taken to begin the labor process (induce labor) or stimulate or strengthen contractions (augment labor).   Medicine is used to soften (ripen) the cervix.  Having to remove the uterus (hysterectomy) if it ruptures. VBAC SHOULD NOT BE DONE IF:  The previous cesarean delivery was done with a vertical (classical) or T-shaped incision or you do not know what kind of incision was made.   You had a ruptured uterus.   You have had certain types of surgery on your uterus, such as removal of uterine fibroids. Ask your health care provider about other types of surgeries that prevent you from having a VBAC.  You have certain medical or childbirth (obstetrical) problems.   There are problems with the baby.   You have had two previous cesarean deliveries and no vaginal  deliveries. OTHER FACTS TO KNOW ABOUT VBAC:  It is safe to have an epidural anesthetic with VBAC.   It is safe to turn the baby from a breech position (attempt an external cephalic version).   It is safe to try a VBAC with twins.   VBAC may not be successful if your baby weights 8.8 lb (4 kg) or more. However, weight predictions are not always accurate and should not be used alone to decide if VBAC is right for you.  There is an increased failure rate if the time between the cesarean delivery and VBAC is less than 19 months.   Your health care provider may advise against a VBAC if you have preeclampsia (high blood pressure, protein in the urine, and swelling of face and extremities).   VBAC is often successful if you previously gave birth vaginally.   VBAC is often successful when the labor starts spontaneously before the due date.   Delivering a baby through a VBAC is similar to having a normal spontaneous vaginal delivery. Document Released: 09/26/2006 Document Revised: 08/20/2013 Document Reviewed: 11/02/2012 [facility] Patient Information 2015 [place], [place]. This information is not intended to replace advice given to you by your health care provider. Make sure you discuss any questions you have with your health care provider.

## 2013-12-13 NOTE — Lactation Note (Addendum)
This note was copied from the chart of [name] [name]. Lactation Consultation Note  Patient Name: [name] [name] Date: 12/13/2013 Reason for consult: Follow-up assessment Per mom baby has been cluster feeding all night and today .  Baby presently latched with depth , and baby released nipple and nipple  Pinky red and slanted . LC discussed making sure the baby is in good alignment  with latch to prevent sore nipples. Instructed on the use of comfort gels , already has a hand pump. Per mom familiar with the use. LC suggested - prior to latch - breast massage , hand express, and firm support. Breast compressions with latch and intermittent. Reviewed prevention and tx of sore nipples and engorgement . Per mom will be moving in a few days to [place]. LC suggested asking her pedis practice in [place] about BF support.    Maternal Data Has patient been taught Hand Expression?: Yes Does the patient have breastfeeding experience prior to this delivery?: Yes  Feeding Feeding Type: Breast Fed (baby latched with depth ) Length of feed: 45 min (per mom on and off )  LATCH Score/Interventions Latch: Grasps breast easily, tongue down, lips flanged, rhythmical sucking. (cradle , with depth )  Audible Swallowing: Spontaneous and intermittent (swallows noted )  Type of Nipple: Everted at rest and after stimulation (nipple misshaped when released )  Comfort (Breast/Nipple): Filling, red/small blisters or bruises, mild/mod discomfort  Problem noted: Mild/Moderate discomfort;Cracked, bleeding, blisters, bruises (nipples pinky red )  Hold (Positioning): No assistance needed to correctly position infant at breast. (mom independent with latch ) Intervention(s): Breastfeeding basics reviewed;Support Pillows;Position options;Skin to skin  LATCH Score: 9  Lactation Tools Discussed/Used WIC Program: No Pump Review: Setup, frequency, and cleaning;Milk Storage Initiated by:: mai  Date  initiated:: 12/13/13   Consult Status Consult Status: Complete    Myer Haff 12/13/2013, 2:26 PM

## 2014-02-18 ENCOUNTER — Encounter (HOSPITAL_COMMUNITY): Payer: Self-pay | Admitting: *Deleted

## 2019-11-02 ENCOUNTER — Encounter

## 2019-11-02 ENCOUNTER — Inpatient Hospital Stay: Admit: 2019-11-02 | Payer: BLUE CROSS/BLUE SHIELD | Primary: Acute Care

## 2019-11-02 DIAGNOSIS — Z1231 Encounter for screening mammogram for malignant neoplasm of breast: Secondary | ICD-10-CM

## 2019-11-13 NOTE — Telephone Encounter (Signed)
Formatting of this note might be different from the original.  Last refill: 06/24/2019  Future ov  None  Last ov 10/10/18  Patient notified on voice mail that she is overdue for office visit with DR Bethena Midget  Electronically signed by Myer Haff at 11/13/2019  4:56 PM EDT

## 2019-12-28 ENCOUNTER — Ambulatory Visit: Admit: 2019-12-28 | Discharge: 2019-12-28 | Payer: BLUE CROSS/BLUE SHIELD | Attending: Acute Care | Primary: Acute Care

## 2019-12-28 ENCOUNTER — Encounter

## 2019-12-28 DIAGNOSIS — G43509 Persistent migraine aura without cerebral infarction, not intractable, without status migrainosus: Secondary | ICD-10-CM

## 2019-12-28 NOTE — Progress Notes (Signed)
12/28/2019    This is a 39 y.o. female   Chief Complaint   Patient presents with   ??? Establish Care     pt previously saw Dr Theadora Rama. no questions or concerns   .      Janice Oliver is here to establish care. She is wanting a provider that is closer to home. She has a history of anxiety and migraines.    Migraines- occure more freuqntly with exercise. Several times a week. Has aura and if she takes medications and lays down it will not always proceed to pain. She has been getting migraines with cardio workouts (she is an Dispensing optician) and has noticed that if she takes naproxen before her class she will not get a migraine. Never had an abortive medication and never been on a daily preventive. Headaches/ migraines have been occurring since adolescent. She is currently taking naproxen for treatment or prevention at least 3-4 times a week.     Anxiety- dx 30s. 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 Stared on lexapro with therapy- helpful. No medications side effects. Coping strategies resting and deep breathing. Will take xanax in past and this was helpful  Rare panpic attacks currently. Previously seen by counselor.        Past Medical History:   Diagnosis Date   ??? Basal cell carcinoma    ??? Migraines        Past Surgical History:   Procedure Laterality Date   ??? CESAREAN SECTION         Social History     Socioeconomic History   ??? Marital status: Married     Spouse name: Not on file   ??? Number of children: Not on file   ??? Years of education: Not on file   ??? Highest education level: Not on file   Occupational History   ??? Not on file   Tobacco Use   ??? Smoking status: Never Smoker   ??? Smokeless tobacco: Never Used   Substance and Sexual Activity   ??? Alcohol use: Yes     Comment: occasional   ??? Drug use: Never   ??? Sexual activity: Not on file   Other Topics Concern   ??? Not on file   Social History Narrative   ??? Not on file     Social Determinants of Health     Financial Resource Strain:    ??? Difficulty of Paying Living  Expenses:    Food Insecurity:    ??? Worried About Programme researcher, broadcasting/film/video in the Last Year:    ??? Barista in the Last Year:    Transportation Needs:    ??? Freight forwarder (Medical):    ??? Lack of Transportation (Non-Medical):    Physical Activity:    ??? Days of Exercise per Week:    ??? Minutes of Exercise per Session:    Stress:    ??? Feeling of Stress :    Social Connections:    ??? Frequency of Communication with Friends and Family:    ??? Frequency of Social Gatherings with Friends and Family:    ??? Attends Religious Services:    ??? Database administrator or Organizations:    ??? Attends Engineer, structural:    ??? Marital Status:    Intimate Programme researcher, broadcasting/film/video Violence:    ??? Fear of Current or Ex-Partner:    ??? Emotionally Abused:    ??? Physically Abused:    ??? Sexually Abused:  Family History   Problem Relation Age of Onset   ??? Breast Cancer Maternal Grandmother 64   ??? Breast Cancer Paternal Grandmother 73   ??? High Blood Pressure Mother    ??? Diabetes Mother         Type 2   ??? High Blood Pressure Father    ??? Heart Disease Father        Current Outpatient Medications   Medication Sig Dispense Refill   ??? naproxen (NAPROSYN) 500 MG tablet TAKE 1 TABLET BY MOUTH DAILY AS NEEDED FOR MILD PAIN (1 3).     ??? valACYclovir (VALTREX) 1 g tablet Take 1,000 mg by mouth 2 times daily as needed     ??? escitalopram (LEXAPRO) 5 MG tablet TAKE 1 TABLET BY MOUTH EVERY DAY       No current facility-administered medications for this visit.       Allergies   Allergen Reactions   ??? Latex    ??? Moxifloxacin Shortness Of Breath       No results found for any previous visit.       Review of Systems   Constitutional: Negative for activity change, chills, fatigue, fever and unexpected weight change.   HENT: Negative for ear discharge, mouth sores, postnasal drip, sinus pressure, sore throat and trouble swallowing.    Eyes: Negative for redness, itching and visual disturbance.   Respiratory: Negative for cough, chest tightness, shortness of  breath and wheezing.    Cardiovascular: Negative for chest pain, palpitations and leg swelling.   Gastrointestinal: Negative for abdominal pain, constipation, diarrhea and nausea.   Genitourinary: Negative for dysuria, frequency and urgency.        [facility]- pap utd-    Musculoskeletal: Negative for arthralgias, joint swelling and myalgias.   Skin: Negative for color change, pallor and rash.   Allergic/Immunologic: Positive for environmental allergies. Negative for food allergies and immunocompromised state.        Allergra   Neurological: Negative for dizziness, syncope, weakness and headaches.   Hematological: Does not bruise/bleed easily.   Psychiatric/Behavioral: Negative for behavioral problems, hallucinations and sleep disturbance. The patient is nervous/anxious.        BP 122/82 (Site: Left Upper Arm, Position: Sitting)    Pulse 81    Temp 97.2 ??F (36.2 ??C) (Infrared)    Resp 18    Wt 117 lb (53.1 kg)    LMP 12/26/2019 (Approximate)    SpO2 99%    Breastfeeding No     Physical Exam  Vitals and nursing note reviewed.   Constitutional:       General: She is not in acute distress.     Appearance: She is well-developed and normal weight. She is not diaphoretic.   HENT:      Head: Normocephalic and atraumatic.      Right Ear: External ear normal.      Left Ear: External ear normal.      Nose: Nose normal.      Mouth/Throat:      Mouth: Mucous membranes are moist.      Pharynx: No oropharyngeal exudate.   Eyes:      General:         Right eye: No discharge.         Left eye: No discharge.      Conjunctiva/sclera: Conjunctivae normal.   Cardiovascular:      Rate and Rhythm: Normal rate and regular rhythm.      Heart sounds:  Normal heart sounds. No murmur heard.   No friction rub. No gallop.    Pulmonary:      Effort: Pulmonary effort is normal. No respiratory distress.      Breath sounds: Normal breath sounds. No wheezing or rales.   Abdominal:      General: Bowel sounds are normal. There is no distension.       Palpations: Abdomen is soft.      Tenderness: There is no abdominal tenderness.   Musculoskeletal:         General: No tenderness. Normal range of motion.      Cervical back: Normal range of motion and neck supple.   Lymphadenopathy:      Cervical: No cervical adenopathy.   Skin:     General: Skin is warm and dry.      Coloration: Skin is not pale.      Findings: No erythema or rash.   Neurological:      Mental Status: She is alert and oriented to person, place, and time.      Motor: No abnormal muscle tone.      Coordination: Coordination normal.   Psychiatric:         Mood and Affect: Mood normal.         Behavior: Behavior normal.         Thought Content: Thought content normal.         Judgment: Judgment normal.         Diagnosis    ICD-10-CM    1. Persistent migraine aura without cerebral infarction and without status migrainosus, not intractable  G43.509    2. Generalized anxiety disorder  F41.1    3. Establishing care with new doctor, encounter for  Z76.89        Assessment andPlan    Continue current medications  Discussed possibility of migraine prophylaxis wants to consider  Anxiety- continue lexapro  May need counseling  No orders of the defined types were placed in this encounter.      No orders of the defined types were placed in this encounter.      Return in about 4 weeks (around 01/25/2020) for annual physical.

## 2019-12-28 NOTE — Patient Instructions (Signed)
Patient Education        Migraine Aura Without a Headache: Care Instructions  Overview     A migraine aura without a headache is a type of migraine. When you have an aura, you may see spots, wavy lines, or flashing lights. Your hands, arms, or face may tingle or feel numb. But unlike other migraines, a headache doesn't follow the aura.  Some people have both types of migraines. Although they sometimes have an aura without the headache, at other times they may get a headache after an aura.  Without treatment, migraines can last from 4 hours to a few days. Medicines can help prevent migraines or stop them once they have started. Your doctor can help you find which ones work best for you.  Follow-up care is a key part of your treatment and safety. Be sure to make and go to all appointments, and call your doctor if you are having problems. It's also a good idea to know your test results and keep a list of the medicines you take.  How can you care for yourself at home?  ?? Do not drive if you have taken a prescription pain medicine.  ?? Rest in a quiet, dark room until your aura or headache is gone. Close your eyes. Try to relax or go to sleep. Don't watch TV or read.  ?? If you get a headache, put a cold, moist cloth or cold pack on the painful area for 10 to 20 minutes at a time. Put a thin cloth between the cold pack and your skin.  ?? Use a warm, moist towel or a heating pad set on low. This can relax tight shoulder and neck muscles.  ?? Have someone gently massage your neck and shoulders.  ?? Be safe with medicines. Take your medicines exactly as prescribed. Call your doctor if you think you are having a problem with your medicine. You will get more details on the specific medicines your doctor prescribes.  ?? Don't take medicine for headache pain too often. Talk to your doctor if you are taking medicine more than 2 days a week to stop a headache. Taking too much pain medicine can lead to more headaches. These are called  medicine-overuse headaches.  To prevent migraines  ?? Keep a diary so you can figure out what triggers your auras or headaches. Avoiding triggers may help you prevent migraines. Record when each aura or headache began, how long it lasted, and what the symptoms were like. Write down any other symptoms you had with the aura. These may include nausea or sensitivity to bright light or loud noise. Note if the aura or headache occurred near your period. List anything that might have triggered the aura. Triggers may include certain foods (chocolate, cheese, wine) or odors, smoke, bright light, stress, or lack of sleep.  ?? If your doctor has prescribed medicine for your migraines, take it as directed. You may have medicine that you take only when you get a migraine and medicine that you take all the time to help prevent migraines.  ? If your doctor has prescribed medicine for when you get migraines, take it at the first sign of an aura, unless your doctor has given you other instructions.  ? If your doctor has prescribed medicine to prevent migraines, take it exactly as prescribed. Call your doctor if you think you are having a problem with your medicine.  ?? Find healthy ways to deal with stress. Migraines are most  common during or right after stressful times. Try finding ways to reduce stress like practicing mindfulness or deep breathing exercises.  ?? Get plenty of sleep and exercise. But be careful to not push yourself too hard during exercise. It may trigger a headache.  ?? Eat regular meals, and avoid foods and drinks that often trigger migraines. These include chocolate and alcohol, especially red wine and port. Chemicals used in food, such as aspartame and monosodium glutamate (MSG), also can trigger migraines. So can some food additives, such as those found in hot dogs, bacon, cold cuts, aged cheeses, and pickled foods.  ?? Limit caffeine by not drinking too much coffee, tea, or soda. But don't quit caffeine suddenly,  because that can also give you migraines.  ?? Do not smoke or allow others to smoke around you. If you need help quitting, talk to your doctor about stop-smoking programs and medicines. These can increase your chances of quitting for good.  ?? If you are taking birth control pills or hormone therapy, talk to your doctor about whether they are triggering your migraines.  When should you call for help?   Call 911 anytime you think you may need emergency care. For example, call if:  ?? ?? You have symptoms of a stroke. These may include:  ? Sudden numbness, tingling, weakness, or loss of movement in your face, arm, or leg, especially on only one side of your body.  ? Sudden vision changes.  ? Sudden trouble speaking.  ? Sudden confusion or trouble understanding simple statements.  ? Sudden problems with walking or balance.  ? A sudden, severe headache that is different from past headaches.   Call your doctor now or seek immediate medical care if:  ?? ?? You have new or worse nausea and vomiting.   ?? ?? You have a new or higher fever.   ?? ?? Your headache gets much worse.   Watch closely for changes in your health, and be sure to contact your doctor if:  ?? ?? You are not getting better after 2 days (48 hours).   Where can you learn more?  Go to [email or web address] and sign in to your MyChart account. Enter H931 in the Search Health Information box to learn more about "Migraine Aura Without a Headache: Care Instructions."     If you do not have an account, please click on the "Sign Up Now" link.  Current as of: November 21, 2018??????????????????????????????Content Version: 12.9  ?? 2006-2021 [employer].   Care instructions adapted under license by [facility]. If you have questions about a medical condition or this instruction, always ask your healthcare professional. [employer] disclaims any warranty or liability for your use of this information.       Patient Education        Deciding About Taking  Medicine to Prevent Migraines  How can you decide about taking medicine to prevent migraine headaches?  What are migraines?  Migraines are painful, throbbing headaches. They can last from 4 to 72 hours. They often occur on only one side of your head. But you may feel them on both sides. The pain may keep you from doing your daily activities.  You may take a daily medicine if you get bad migraines often. This can help prevent them.  What are key points about this decision?  ?? Medicines to prevent migraines may not stop them every time. But if you take them daily, you can reduce how many migraines you get  by more than half. They can also reduce how long migraines last. And your symptoms may not be as bad.  ?? Medicines that prevent migraines may cause side effects. You may have sleep and memory problems, upset stomach, dry mouth, or constipation. Some of these side effects may last for as long as you take the medicine. Or they may go away within a few weeks.  Why might you choose to take medicine to prevent migraines?  ?? You are willing to take medicine daily if it will help your symptoms.  ?? You don't think the side effects of the medicine could be as bad as your migraine symptoms.  ?? Your migraines get in the way of your work. Or they harm your relationships with friends and family.  ?? Benefits of medicine include fewer or no migraines. And your migraines may not last as long or feel as bad.  Why might you choose not to take medicine to prevent migraines?  ?? You want to avoid the side effects of the medicine.  ?? You don't want to take medicine every day.  ?? Your migraines are not affecting your work and relationships.  ?? If your symptoms don't improve with home treatment and other medicines, you can decide later to take medicine every day to help prevent migraines.  Your decision  Thinking about the facts and your feelings can help you make a decision that is right for you. Be sure you understand the benefits and  risks of your options, and think about what else you need to do before you make the decision.  Where can you learn more?  Go to [email or web address] and sign in to your MyChart account. Enter [phone] in the Search Health Information box to learn more about "Deciding About Taking Medicine to Prevent Migraines."     If you do not have an account, please click on the "Sign Up Now" link.  Current as of: November 21, 2018??????????????????????????????Content Version: 12.9  ?? 2006-2021 [employer].   Care instructions adapted under license by [facility]. If you have questions about a medical condition or this instruction, always ask your healthcare professional. [employer] disclaims any warranty or liability for your use of this information.

## 2020-01-04 ENCOUNTER — Encounter

## 2020-01-05 LAB — COMPREHENSIVE METABOLIC PANEL
ALT: 19 U/L (ref 10–40)
AST: 20 U/L (ref 15–37)
Albumin/Globulin Ratio: 2.4 — ABNORMAL HIGH (ref 1.1–2.2)
Albumin: 4.8 g/dL (ref 3.4–5.0)
Alkaline Phosphatase: 55 U/L (ref 40–129)
Anion Gap: 16 (ref 3–16)
BUN: 14 mg/dL (ref 7–20)
CO2: 22 mmol/L (ref 21–32)
Calcium: 9.3 mg/dL (ref 8.3–10.6)
Chloride: 101 mmol/L (ref 99–110)
Creatinine: 0.6 mg/dL (ref 0.6–1.1)
GFR African American: >60 (ref >60)
GFR Non-African American: >60 (ref >60)
Globulin: 2 g/dL
Glucose: 93 mg/dL (ref 70–99)
Potassium: 4.8 mmol/L (ref 3.5–5.1)
Sodium: 139 mmol/L (ref 136–145)
Total Bilirubin: 0.8 mg/dL (ref 0.0–1.0)
Total Protein: 6.8 g/dL (ref 6.4–8.2)

## 2020-01-05 LAB — CBC WITH AUTO DIFFERENTIAL
Basophils %: 1.2 %
Basophils Absolute: 0.1 10*3/uL (ref 0.0–0.2)
Eosinophils %: 4 %
Eosinophils Absolute: 0.3 10*3/uL (ref 0.0–0.6)
Hematocrit: 41.5 % (ref 36.0–48.0)
Hemoglobin: 14 g/dL (ref 12.0–16.0)
Lymphocytes %: 19.5 %
Lymphocytes Absolute: 1.5 10*3/uL (ref 1.0–5.1)
MCH: 32.5 pg (ref 26.0–34.0)
MCHC: 33.7 g/dL (ref 31.0–36.0)
MCV: 96.5 fL (ref 80.0–100.0)
MPV: 7.7 fL (ref 5.0–10.5)
Monocytes %: 8.5 %
Monocytes Absolute: 0.6 10*3/uL (ref 0.0–1.3)
Neutrophils %: 66.8 %
Neutrophils Absolute: 5 10*3/uL (ref 1.7–7.7)
Platelets: 297 10*3/uL (ref 135–450)
RBC: 4.3 M/uL (ref 4.00–5.20)
RDW: 13.6 % (ref 12.4–15.4)
WBC: 7.5 10*3/uL (ref 4.0–11.0)

## 2020-01-05 LAB — LIPID PANEL
Cholesterol, Total: 165 mg/dL (ref 0–199)
HDL: 71 mg/dL — ABNORMAL HIGH (ref 40–60)
LDL Calculated: 88 mg/dL (ref <100)
Triglycerides: 31 mg/dL (ref 0–150)
VLDL Cholesterol Calculated: 6 mg/dL

## 2020-01-06 LAB — TSH WITH REFLEX TO FT4: TSH Reflex FT4: 4.48 u[IU]/mL — ABNORMAL HIGH (ref 0.27–4.20)

## 2020-01-06 LAB — T4, FREE: T4 Free: 1.2 ng/dL (ref 0.9–1.8)

## 2020-01-07 NOTE — Other (Signed)
Your labs overall look good. Your tsh is only mildly elevated with a normal level of circulating hormone. I would recheck this next year

## 2020-01-25 ENCOUNTER — Ambulatory Visit: Admit: 2020-01-25 | Discharge: 2020-01-25 | Payer: BLUE CROSS/BLUE SHIELD | Attending: Acute Care | Primary: Acute Care

## 2020-01-25 DIAGNOSIS — Z0000 Encounter for general adult medical examination without abnormal findings: Secondary | ICD-10-CM

## 2020-01-25 MED ORDER — ACYCLOVIR 5 % EX OINT
5 % | CUTANEOUS | 1 refills | Status: AC
Start: 2020-01-25 — End: 2020-02-01

## 2020-01-25 MED ORDER — VALACYCLOVIR HCL 1 G PO TABS
1 g | ORAL_TABLET | ORAL | 1 refills | Status: DC
Start: 2020-01-25 — End: 2021-01-05

## 2020-01-25 NOTE — Telephone Encounter (Signed)
Appointment Request     Patient requesting earlier appointment: No  Appointment offered to patient: YES  Patient Contact Number: [phone]    PATIENT CALLED TO SCHEDULE AN APPT, HOWEVER DID STATE THEY HAD A SORE THROAT EARLIER IN THE WEEK. APPT WAS SCHEDULED 14 DAYS OUT, UNLESS A VV MAY BE AVAIL

## 2020-01-25 NOTE — Patient Instructions (Signed)
Patient Education        Well Visit, Ages 18 to 50: Care Instructions  Overview     Well visits can help you stay healthy. Your doctor has checked your overall health and may have suggested ways to take good care of yourself. Your doctor also may have recommended tests. At home, you can help prevent illness with healthy eating, regular exercise, and other steps.  Follow-up care is a key part of your treatment and safety. Be sure to make and go to all appointments, and call your doctor if you are having problems. It's also a good idea to know your test results and keep a list of the medicines you take.  How can you care for yourself at home?  ?? Get screening tests that you and your doctor decide on. Screening helps find diseases before any symptoms appear.  ?? Eat healthy foods. Choose fruits, vegetables, whole grains, protein, and low-fat dairy foods. Limit fat, especially saturated fat. Reduce salt in your diet.  ?? Limit alcohol. If you are a man, have no more than 2 drinks a day or 14 drinks a week. If you are a woman, have no more than 1 drink a day or 7 drinks a week.  ?? Get at least 30 minutes of physical activity on most days of the week. Walking is a good choice. You also may want to do other activities, such as running, swimming, cycling, or playing tennis or team sports. Discuss any changes in your exercise program with your doctor.  ?? Reach and stay at a healthy weight. This will lower your risk for many problems, such as obesity, diabetes, heart disease, and high blood pressure.  ?? Do not smoke or allow others to smoke around you. If you need help quitting, talk to your doctor about stop-smoking programs and medicines. These can increase your chances of quitting for good.  ?? Care for your mental health. It is easy to get weighed down by worry and stress. Learn strategies to manage stress, like deep breathing and mindfulness, and stay connected with your family and community. If you find you often feel sad  or hopeless, talk with your doctor. Treatment can help.  ?? Talk to your doctor about whether you have any risk factors for sexually transmitted infections (STIs). You can help prevent STIs if you wait to have sex with a new partner (or partners) until you've each been tested for STIs. It also helps if you use condoms (female or female condoms) and if you limit your sex partners to one person who only has sex with you. Vaccines are available for some STIs, such as HPV.  ?? Use birth control if it's important to you to prevent pregnancy. Talk with your doctor about the choices available and what might be best for you.  ?? If you think you may have a problem with alcohol or drug use, talk to your doctor. This includes prescription medicines (such as amphetamines and opioids) and illegal drugs (such as cocaine and methamphetamine). Your doctor can help you figure out what type of treatment is best for you.  ?? Protect your skin from too much sun. When you're outdoors from 10 a.m. to 4 p.m., stay in the shade or cover up with clothing and a hat with a wide brim. Wear sunglasses that block UV rays. Even when it's cloudy, put broad-spectrum sunscreen (SPF 30 or higher) on any exposed skin.  ?? See a dentist one or two times a   year for checkups and to have your teeth cleaned.  ?? Wear a seat belt in the car.  When should you call for help?  Watch closely for changes in your health, and be sure to contact your doctor if you have any problems or symptoms that concern you.  Where can you learn more?  Go to [email or web address] and sign in to your MyChart account. Enter P072 in the Search Health Information box to learn more about "Well Visit, Ages 18 to 50: Care Instructions."     If you do not have an account, please click on the "Sign Up Now" link.  Current as of: May 31, 2019??????????????????????????????Content Version: 13.0  ?? 2006-2021 [employer].   Care instructions adapted under license by [facility].  If you have questions about a medical condition or this instruction, always ask your healthcare professional. [employer] disclaims any warranty or liability for your use of this information.

## 2020-01-25 NOTE — Progress Notes (Signed)
Irwin Brakeman  Date of Birth:  1980/11/06    Irwin Brakeman    Date of Service:  01/25/2020    Chief Complaint:   Astin Rape is a 39 y.o. female who presents for complete physical examination.    HPI: Alora is here for her annual physical.     Wt Readings from Last 3 Encounters:   01/25/20 116 lb (52.6 kg)   12/28/19 117 lb (53.1 kg)       BP Readings from Last 3 Encounters:   01/25/20 122/68   12/28/19 122/82       Vitals:    01/25/20 1005   BP: 122/68   Site: Left Upper Arm   Position: Sitting   Pulse: 81   Resp: 18   Temp: 96.8 ??F (36 ??C)   TempSrc: Infrared   SpO2: 98%   Weight: 116 lb (52.6 kg)       Patient Active Problem List   Diagnosis   ??? Basal cell carcinoma of skin   ??? Generalized anxiety disorder   ??? Chronic midline low back pain without sciatica   ??? Migraine   ??? Seasonal allergic rhinitis   ??? Uterine scar from previous cesarean delivery, antepartum       Preventive Care:  Health Maintenance   Topic Date Due   ??? Hepatitis C screen  Never done   ??? Varicella vaccine (1 of 2 - 2-dose childhood series) Never done   ??? HIV screen  Never done   ??? DTaP/Tdap/Td vaccine (1 - Tdap) Never done   ??? Cervical cancer screen  Never done   ??? Flu vaccine (1) Never done   ??? Lipid screen  01/03/2025   ??? COVID-19 Vaccine  Completed   ??? Hepatitis A vaccine  Aged Out   ??? Hepatitis B vaccine  Aged Out   ??? Hib vaccine  Aged Out   ??? Meningococcal (ACWY) vaccine  Aged Out   ??? Pneumococcal 0-64 years Vaccine  Aged Out      Last eye exam: 2021, normal contacts  Exercise: dance aerobic instructor  Seatbelt use: yes  Lipid panel:   Lab Results   Component Value Date    CHOL 165 01/04/2020    TRIG 31 01/04/2020    HDL 71 (H) 01/04/2020    LDLCALC 88 01/04/2020      Screenings due: none       Immunization History   Administered Date(s) Administered   ??? COVID-19, Moderna, PF, 127mcg/0.5mL 06/14/2019   ??? COVID-19, Pfizer, PF, 41mcg/0.3mL 06/14/2019, 07/05/2019       Allergies   Allergen Reactions   ??? Latex    ??? Moxifloxacin  Shortness Of Breath       Current Outpatient Medications   Medication Sig Dispense Refill   ??? naproxen (NAPROSYN) 500 MG tablet TAKE 1 TABLET BY MOUTH DAILY AS NEEDED FOR MILD PAIN (1 3).     ??? valACYclovir (VALTREX) 1 g tablet Take 1,000 mg by mouth 2 times daily as needed     ??? escitalopram (LEXAPRO) 5 MG tablet TAKE 1 TABLET BY MOUTH EVERY DAY       No current facility-administered medications for this visit.       Past Medical History:   Diagnosis Date   ??? Allergies     on allegra   ??? Basal cell carcinoma    ??? GAD (generalized anxiety disorder)    ??? Migraines        Past Surgical History:  Procedure Laterality Date   ??? CESAREAN SECTION         Family History   Problem Relation Age of Onset   ??? High Blood Pressure Mother    ??? Diabetes Mother         Type 2   ??? Hypertension Father    ??? High Blood Pressure Father    ??? Heart Disease Father    ??? No Known Problems Sister    ??? No Known Problems Sister    ??? Other Brother         autoimmune disease   ??? Hypertension Brother    ??? Breast Cancer Maternal Grandmother 80   ??? Alcohol Abuse Maternal Grandfather    ??? Breast Cancer Paternal Grandmother 60   ??? Alzheimer's Disease Paternal Grandmother    ??? No Known Problems Paternal Grandfather        Social History     Socioeconomic History   ??? Marital status: Married     Spouse name: Not on file   ??? Number of children: Not on file   ??? Years of education: Not on file   ??? Highest education level: Not on file   Occupational History   ??? Not on file   Tobacco Use   ??? Smoking status: Never Smoker   ??? Smokeless tobacco: Never Used   Substance and Sexual Activity   ??? Alcohol use: Yes     Comment: occasional   ??? Drug use: Never   ??? Sexual activity: Not on file   Other Topics Concern   ??? Not on file   Social History Narrative   ??? Not on file     Social Determinants of Health     Financial Resource Strain: Low Risk    ??? Difficulty of Paying Living Expenses: Not hard at all   Food Insecurity: No Food Insecurity   ??? Worried About Patent examiner in the Last Year: Never true   ??? Ran Out of Food in the Last Year: Never true   Transportation Needs:    ??? Lack of Transportation (Medical):    ??? Lack of Transportation (Non-Medical):    Physical Activity:    ??? Days of Exercise per Week:    ??? Minutes of Exercise per Session:    Stress:    ??? Feeling of Stress :    Social Connections:    ??? Frequency of Communication with Friends and Family:    ??? Frequency of Social Gatherings with Friends and Family:    ??? Attends Religious Services:    ??? Database administrator or Organizations:    ??? Attends Engineer, structural:    ??? Marital Status:    Intimate Programme researcher, broadcasting/film/video Violence:    ??? Fear of Current or Ex-Partner:    ??? Emotionally Abused:    ??? Physically Abused:    ??? Sexually Abused:        Review of Systems:  Review of Systems   Constitutional: Negative for activity change, chills, fatigue, fever and unexpected weight change.   HENT: Negative for ear discharge, mouth sores, postnasal drip, sinus pressure, sore throat and trouble swallowing.         Regular dental care   Eyes: Negative for redness, itching and visual disturbance.        Last eye exam 2021   Respiratory: Negative for cough, chest tightness, shortness of breath and wheezing.    Cardiovascular: Negative for chest pain, palpitations and  leg swelling.   Gastrointestinal: Negative for abdominal pain, constipation, diarrhea and nausea.   Endocrine: Negative for cold intolerance, heat intolerance, polydipsia, polyphagia and polyuria.   Genitourinary: Negative for dysuria, frequency and urgency.   Musculoskeletal: Positive for arthralgias (right hip). Negative for gait problem, joint swelling and myalgias.   Skin: Negative for color change, pallor and rash.   Allergic/Immunologic: Negative for environmental allergies, food allergies and immunocompromised state.   Neurological: Negative for dizziness, syncope, weakness and headaches.   Hematological: Does not bruise/bleed easily.   Psychiatric/Behavioral:  Negative for behavioral problems, hallucinations and sleep disturbance. The patient is not nervous/anxious.        Physical Exam:     There is no height or weight on file to calculate BMI.     Physical Exam  Vitals and nursing note reviewed.   Constitutional:       General: She is not in acute distress.     Appearance: She is well-developed. She is not diaphoretic.   HENT:      Head: Normocephalic and atraumatic.      Right Ear: External ear normal.      Left Ear: External ear normal.      Nose: Nose normal.      Mouth/Throat:      Pharynx: No oropharyngeal exudate.   Eyes:      General:         Right eye: No discharge.         Left eye: No discharge.      Conjunctiva/sclera: Conjunctivae normal.   Cardiovascular:      Rate and Rhythm: Normal rate and regular rhythm.      Heart sounds: Normal heart sounds. No murmur heard.   No friction rub. No gallop.    Pulmonary:      Effort: Pulmonary effort is normal. No respiratory distress.      Breath sounds: Normal breath sounds. No wheezing or rales.   Abdominal:      General: Bowel sounds are normal. There is no distension.      Palpations: Abdomen is soft.      Tenderness: There is no abdominal tenderness.   Musculoskeletal:         General: No tenderness. Normal range of motion.      Cervical back: Normal range of motion and neck supple.   Lymphadenopathy:      Cervical: No cervical adenopathy.   Skin:     General: Skin is warm and dry.      Coloration: Skin is not pale.      Findings: No erythema or rash.   Neurological:      Mental Status: She is alert and oriented to person, place, and time.      Motor: No abnormal muscle tone.      Coordination: Coordination normal.   Psychiatric:         Behavior: Behavior normal.         Thought Content: Thought content normal.         Judgment: Judgment normal.         Assessment      ICD-10-CM    1. Annual physical exam  Z00.00    2. Persistent migraine aura without cerebral infarction and without status migrainosus, not  intractable  G43.509    3. H/O cold sores  Z86.19 valACYclovir (VALTREX) 1 g tablet     acyclovir (ZOVIRAX) 5 % ointment   4. Needs flu shot  Z23  5. Chronic right hip pain  M25.551 [place] - Noah CharonBansal, ArpinAnkit, MD, Orthopedic Surgery, [place]    [phone]        Plan    Generally healthy adult  Discussed hsv prophylaxsis  Topical and oral hsv treatment  Referral to ortho for chronic hip pain        Orders Placed This Encounter   Medications   ??? valACYclovir (VALTREX) 1 g tablet     Sig: Take two tablets every 12 hours for 1 day.     Dispense:  12 tablet     Refill:  1   ??? acyclovir (ZOVIRAX) 5 % ointment     Sig: Apply topically 5 times daily.     Dispense:  30 g     Refill:  1        Orders Placed This Encounter   Procedures   ??? [place] - Noah CharonBansal, [place], MD, [facility]     Referral Priority:   Routine     Referral Type:   Eval and Treat     Referral Reason:   Specialty Services Required     Referred to Provider:   Darrol JumpAnkit Bansal, MD     Requested Specialty:   Orthopedic Surgery     Number of Visits Requested:   1       Patient Education:    Counseled on importance of healthy diet and regular exercise of at least 30 minutes on four or more days during the week.    Counseled on skin safety, SPF 30 or higher prior to going outdoors and reapplication every twohours while outside. Monitor moles for changes, report to provider if greater than 6 mm, color variations, asymmetry, redness, scales, and/or overlying skin changes    Counseled on safety, wear seatbelt, do not consume alcohol and drive or drive with anyone who has consumed alcohol    Counseled on importance of monthly self breast exams, perform on same time each month, monitor for newlumps/bumps/masses, tenderness, changes to size or contour, dimpling, nipple discharge, new nipple retraction, and overlying skin changes, report to provider    Follow Up  1 year or as needed

## 2020-01-25 NOTE — Telephone Encounter (Signed)
VV not available, especially for a New patient    JM

## 2020-02-04 NOTE — Telephone Encounter (Signed)
From: Irwin Brakeman  To: Doran Clay, APRN - CNP  Sent: 02/04/2020 8:43 AM EDT  Subject: Visit Follow-Up Question    Hi there! My provider, [name], suggested I get my thyroid levels checked again, but then we didn???t talk about a time frame. When should I do that and do I need to schedule an appointment to see her after I get my blood taken again?     Thanks!

## 2020-02-11 ENCOUNTER — Ambulatory Visit: Admit: 2020-02-11 | Discharge: 2020-02-17 | Payer: BLUE CROSS/BLUE SHIELD | Primary: Acute Care

## 2020-02-11 ENCOUNTER — Encounter

## 2020-02-11 ENCOUNTER — Ambulatory Visit
Admit: 2020-02-11 | Discharge: 2020-02-11 | Payer: BLUE CROSS/BLUE SHIELD | Attending: Physician Assistant | Primary: Acute Care

## 2020-02-11 DIAGNOSIS — M25551 Pain in right hip: Secondary | ICD-10-CM

## 2020-02-11 NOTE — Progress Notes (Signed)
Dr Darrol Jump      Date /Time 02/11/2020       9:07 AM EDT  Name Janice Oliver             1980-07-25   Location  [facility]  MRN [id number]                Chief Complaint   Patient presents with   ??? Hip Pain     RIGHT HIP PAIN         History of Present Illness    Janice Oliver is a 39 y.o. female who presents with  right hip pain.    Sent in consultation by Doran Clay, APRN - CNP,.    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  Athletic/exercise activity: Exercise classes.  Injury Mechanism:  none.  Worker's Comp. & legal issues:   none.  Previous Treatments: Ice, Heat and NSAIDs    Patient presents to the office today for a new problem.  Patient here with a chief complaint of right hip pain.  Patient's right hip has been painful off and on for 2 years.  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  She is also attended massage therapy without any significant improvement.  She did do physical therapy with her chiropractor again without improvement.  She does home stretches on a daily basis.  Pain is concentrated anterior hip.  No specific injury or trauma.    Past History  Past Medical History:   Diagnosis Date   ??? Allergies     on allegra   ??? Basal cell carcinoma    ??? GAD (generalized anxiety disorder)    ??? Migraines      Past Surgical History:   Procedure Laterality Date   ??? CESAREAN SECTION       Family History   Problem Relation Age of Onset   ??? High Blood Pressure Mother    ??? Diabetes Mother         Type 2   ??? Hypertension Father    ??? High Blood Pressure Father    ??? Heart Disease Father    ??? No Known Problems Sister    ??? No Known Problems Sister    ??? Other Brother         autoimmune disease   ??? Hypertension Brother    ??? Breast Cancer Maternal Grandmother 80   ??? Alcohol Abuse Maternal Grandfather    ??? Breast Cancer  Paternal Grandmother 56   ??? Alzheimer's Disease Paternal Grandmother    ??? No Known Problems Paternal Grandfather      Social History     Tobacco Use   ??? Smoking status: Never Smoker   ??? Smokeless tobacco: Never Used   Substance Use Topics   ??? Alcohol use: Yes     Comment: occasional      Current Outpatient Medications on File Prior to Visit   Medication Sig Dispense Refill   ??? valACYclovir (VALTREX) 1 g tablet Take two tablets every 12 hours for 1 day. 12 tablet 1   ??? naproxen (NAPROSYN) 500 MG tablet TAKE 1 TABLET BY MOUTH DAILY AS NEEDED FOR MILD PAIN (1 3).     ??? escitalopram (LEXAPRO) 5 MG tablet TAKE 1 TABLET BY MOUTH EVERY DAY       No current facility-administered medications  on file prior to visit.        ASCVD 10-YEAR RISK SCORE  The 10-year ASCVD risk score Denman George DC Jr., et al., 2013) is: 0.2%    Values used to calculate the score:      Age: 49 years      Sex: Female      Is Non-Hispanic African American: No      Diabetic: No      Tobacco smoker: No      Systolic Blood Pressure: 122 mmHg      Is BP treated: No      HDL Cholesterol: 71 mg/dL      Total Cholesterol: 165 mg/dL   .  Review of Systems  10-point ROS is negative other than HPI.    Physical Exam  Based off 1997 Exam Criteria  Ht 5\' 2"  (1.575 m)    Wt 116 lb (52.6 kg)    BMI 21.22 kg/m??      Constitutional:       General: He is not in acute distress.     Appearance: Normal appearance.   Cardiovascular:      Rate and Rhythm: Normal rate and regular rhythm.      Pulses: Normal pulses.   Pulmonary:      Effort: Pulmonary effort is normal. No respiratory distress.   Neurological:      Mental Status: He is alert and oriented to person, place, and time. Mental status is at baseline.     Musculoskeletal:  Gait:  normal    Spine / Hip Exam:      RIGHT  LEFT    Lumbar Spine Exam  [x]  All Neg    [x]  All Neg     Straight leg raise  []   [] Not tested   []   [] Not tested    Clonus  []   [] Not tested   []   [] Not tested    Pain with motion  []   [] Not tested   []    [] Not tested    Radiculopathy  []   [] Not tested   []   [] Not tested    Paraspinal muscle tenderness  []  Paraspinal  [] Midline   []  Paraspinal  [] Midline   Sensation RIGHT  LEFT    L3  [x]  Normal [] Decreased    [x]  Normal [] Decreased   L4  [x]  Normal  [] Decreased   [x]  Normal [] Decreased   L5  [x]  Normal [] Decreased   [x]  Normal [] Decreased   S1  [x]  Normal  [] Decreased   [x]  Normal [] Decreased   Pelvis       Scoliosis  [x]  Nml  []  Present     Leg-length discrepency  [x]  Equal  []  Right longer   []  Left longer   Range of Motion Active Passive Active Passive   Hip Flexion 120  120    Abduction 50  50    External Rotation @ 90 flex 75  75    Internal Rotation @ 90 flex 20  20           Hip Impingement / Dysplasia  []  All Neg  []  Not tested   [x]  All Neg  []  Not tested    Hip impingement test  [x]   [] Not tested   []   [] Not tested    C-sign  [x]   [] Not tested   []   [] Not tested    Anterior instability apprehension  []   [] Not tested   []   [] Not tested    Posterior instability apprehension  []   []   Not tested   []   [] Not tested    Uncontained Internal rotation  []   [] Not tested  []   [] Not tested          Abductors  [x]  All Neg  []  Not tested   [x]  All Neg  []  Not tested    Medius strength  []   [] Not tested   []   [] Not tested    Minimum strength  []   [] Not tested   []   [] Not tested    IT band tendonitis  []   [] Not tested   []   [] Not tested    Trochanteric tenderness  []   [] Not tested  []   [] Not tested   Sciatic neuropathic pain  []   [] Not tested   []   [] Not tested           Post-arthroplasty  []  All Neg  []  Not tested   []  All Neg  []  Not tested    Rectus tendonitis  []   [] Not tested   []   [] Not tested    Iliopsoas tendonitis       Start-up pain  []   [] Not tested   []   [] Not tested          Imaging    Right Hip: [facility]  Radiographs: X-rays were ordered today reviewed of the right hip.  3 views.  AP pelvis, lateral, and false profile.  They demonstrate no evidence of fractures or dislocations.  Lateral center  edge angle of 32.      Assessment and Plan  Janice Oliver was seen today for hip pain.    Diagnoses and all orders for this visit:    Right hip pain  -     XR HIP RIGHT (2-3 VIEWS); Future    Impingement syndrome, hip, right  -     MRI HIP RIGHT WO CONTRAST; Future    Hip flexor tendinitis, right        Patient is ordered an 3D MRI of the right hip.  No rendering at this time.  I will have her follow-up with Dr. to evaluate MRI for possible impingement syndrome.  Patient will continue with her home exercises and over-the-counter NSAIDs.    I discussed with Janice Oliver that her history, symptoms, signs and imaging are most consistent with femoro-acetabular impingement    We reviewed the natural history of these conditions and treatment options ranging from conservative measures (rest, icing, activity modification, physical therapy, pain meds, cortisone injection) to surgical options.     In terms of treatment, I recommended continuing with rest, icing, avoidance of painful activities, NSAIDs or pain meds as tolerated, and physical therapy.     If these are not effective, cortisone injection can be considered.    We discussed surgical options as well, should conservative measures fail.    Electronically signed by , PA-C on 02/11/2020 at 9:07 AM  This dictation was generated by voice recognition computer software.  Although all attempts are made to edit the dictation for accuracy, there may be errors in the transcription that are not intended.

## 2020-02-11 NOTE — Telephone Encounter (Signed)
MRI RIGHT HIP approved Auth# [id number]    Please call [facility] to schedule     Then Follow up with Dr Noah Charon in the office     JM

## 2020-02-15 NOTE — Telephone Encounter (Signed)
LM MRI need to be scheduled at [facility] as per scheduling sheet     [phone]     [address]  [place], [place] [address]    Janice Oliver

## 2020-02-22 ENCOUNTER — Ambulatory Visit: Payer: BLUE CROSS/BLUE SHIELD | Primary: Acute Care

## 2020-02-29 ENCOUNTER — Inpatient Hospital Stay: Admit: 2020-02-29 | Payer: BLUE CROSS/BLUE SHIELD | Primary: Acute Care

## 2020-02-29 DIAGNOSIS — M25851 Other specified joint disorders, right hip: Secondary | ICD-10-CM

## 2020-03-17 ENCOUNTER — Ambulatory Visit
Admit: 2020-03-17 | Discharge: 2020-03-17 | Payer: BLUE CROSS/BLUE SHIELD | Attending: Physician Assistant | Primary: Acute Care

## 2020-03-17 DIAGNOSIS — M25851 Other specified joint disorders, right hip: Secondary | ICD-10-CM

## 2020-03-17 NOTE — Progress Notes (Signed)
Dr Darrol Jump      Date /Time 03/17/2020       9:07 AM EDT  Name Janice Oliver             08/07/1980   Location  [facility]  MRN <[id number]>                No chief complaint on file.       History of Present Illness    Janice Oliver is a 39 y.o. female who presents with  right hip pain.    Sent in consultation by Doran Clay, APRN - CNP,.    At patient's last visit she was ordered an MRI.  MRI ordered to evaluate for labral pathology and possible hip impingement.  Her symptoms grossly unchanged since her last visit.  Pain concentrated anterior hip.    Previous history: Patient presents to the office today for a new problem.  Patient here with a chief complaint of right hip pain.  Patient's right hip has been painful off and on for 2 years.  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  She is also attended massage therapy without any significant improvement.  She did do physical therapy with her chiropractor again without improvement.  She does home stretches on a daily basis.  Pain is concentrated anterior hip.  No specific injury or trauma.    Past History  Past Medical History:   Diagnosis Date   ??? Allergies     on allegra   ??? Basal cell carcinoma    ??? GAD (generalized anxiety disorder)    ??? Migraines      Past Surgical History:   Procedure Laterality Date   ??? CESAREAN SECTION       Family History   Problem Relation Age of Onset   ??? High Blood Pressure Mother    ??? Diabetes Mother         Type 2   ??? Hypertension Father    ??? High Blood Pressure Father    ??? Heart Disease Father    ??? No Known Problems Sister    ??? No Known Problems Sister    ??? Other Brother         autoimmune disease   ??? Hypertension Brother    ??? Breast Cancer Maternal Grandmother 80   ??? Alcohol Abuse Maternal Grandfather    ??? Breast Cancer Paternal Grandmother 96   ??? Alzheimer's Disease Paternal Grandmother    ??? No Known  Problems Paternal Grandfather      Social History     Tobacco Use   ??? Smoking status: Never Smoker   ??? Smokeless tobacco: Never Used   Substance Use Topics   ??? Alcohol use: Yes     Comment: occasional      Current Outpatient Medications on File Prior to Visit   Medication Sig Dispense Refill   ??? valACYclovir (VALTREX) 1 g tablet Take two tablets every 12 hours for 1 day. 12 tablet 1   ??? naproxen (NAPROSYN) 500 MG tablet TAKE 1 TABLET BY MOUTH DAILY AS NEEDED FOR MILD PAIN (1 3).     ??? escitalopram (LEXAPRO) 5 MG tablet TAKE 1 TABLET BY MOUTH EVERY DAY       No current facility-administered medications on file prior to visit.        ASCVD 10-YEAR RISK SCORE  The  10-year ASCVD risk score Denman George DC Montez Hageman., et al., 2013) is: 0.2%    Values used to calculate the score:      Age: 69 years      Sex: Female      Is Non-Hispanic African American: No      Diabetic: No      Tobacco smoker: No      Systolic Blood Pressure: 122 mmHg      Is BP treated: No      HDL Cholesterol: 71 mg/dL      Total Cholesterol: 165 mg/dL   .  Review of Systems  10-point ROS is negative other than HPI.    Physical Exam  Based off 1997 Exam Criteria  There were no vitals taken for this visit.     Constitutional:       General: He is not in acute distress.     Appearance: Normal appearance.   Cardiovascular:      Rate and Rhythm: Normal rate and regular rhythm.      Pulses: Normal pulses.   Pulmonary:      Effort: Pulmonary effort is normal. No respiratory distress.   Neurological:      Mental Status: He is alert and oriented to person, place, and time. Mental status is at baseline.     Musculoskeletal:  Gait:  normal    Spine / Hip Exam:      RIGHT  LEFT    Lumbar Spine Exam  [x]  All Neg    [x]  All Neg     Straight leg raise  []   [] Not tested   []   [] Not tested    Clonus  []   [] Not tested   []   [] Not tested    Pain with motion  []   [] Not tested   []   [] Not tested    Radiculopathy  []   [] Not tested   []   [] Not tested    Paraspinal muscle tenderness  []   Paraspinal  [] Midline   []  Paraspinal  [] Midline   Sensation RIGHT  LEFT    L3  [x]  Normal [] Decreased    [x]  Normal [] Decreased   L4  [x]  Normal  [] Decreased   [x]  Normal [] Decreased   L5  [x]  Normal [] Decreased   [x]  Normal [] Decreased   S1  [x]  Normal  [] Decreased   [x]  Normal [] Decreased   Pelvis       Scoliosis  [x]  Nml  []  Present     Leg-length discrepency  [x]  Equal  []  Right longer   []  Left longer   Range of Motion Active Passive Active Passive   Hip Flexion 120  120    Abduction 50  50    External Rotation @ 90 flex 75  75    Internal Rotation @ 90 flex 20  20           Hip Impingement / Dysplasia  []  All Neg  []  Not tested   [x]  All Neg  []  Not tested    Hip impingement test  [x]   [] Not tested   []   [] Not tested    C-sign  [x]   [] Not tested   []   [] Not tested    Anterior instability apprehension  []   [] Not tested   []   [] Not tested    Posterior instability apprehension  []   [] Not tested   []   [] Not tested    Uncontained Internal rotation  []   [] Not tested  []   [] Not tested  Abductors  [x]  All Neg  []  Not tested   [x]  All Neg  []  Not tested    Medius strength  []   [] Not tested   []   [] Not tested    Minimum strength  []   [] Not tested   []   [] Not tested    IT band tendonitis  []   [] Not tested   []   [] Not tested    Trochanteric tenderness  []   [] Not tested  []   [] Not tested   Sciatic neuropathic pain  []   [] Not tested   []   [] Not tested           Post-arthroplasty  []  All Neg  []  Not tested   []  All Neg  []  Not tested    Rectus tendonitis  []   [] Not tested   []   [] Not tested    Iliopsoas tendonitis       Start-up pain  []   [] Not tested   []   [] Not tested          Imaging    Right Hip: [facility]  Previous Radiographs: X-rays were ordered today reviewed of the right hip.  3 views.  AP pelvis, lateral, and false profile.  They demonstrate no evidence of fractures or dislocations.  Lateral center edge angle of 32.    MRI of the right hip without contrast   ??   HISTORY: Impingement syndrome  hip.   ??   FINDINGS:   ??   No evidence of a marrow infiltrative abnormality or an acute traumatic or stress fracture. There is exaggerated hematopoietic marrow. The gluteus medius, gluteus minimus, iliopsoas, and hamstring tendons are intact.   ??   The muscles are symmetric bilaterally. The sciatic nerves are normal in signal intensity.   ??   There is no evidence of a high-grade chondral abnormality in the right hip joint. The acetabular labrum is intact. No evidence of an adjacent para labral ganglion cyst.   ??   There is degenerative disc disease at L5-S1. There appears to be a diffuse disc bulge or a broad-based central disc protrusion at L5-S1. No evidence of a high-grade central canal or neural foraminal stenosis. Please note that the lumbar spine was not    imaged on this examination, therefore accurate counting is not possible. There does appear to be a rudimentary disc at what is likely the S1-S2 level.   ??   ??   Impression   ??   Unremarkable MRI of the right hip.   ??   Diffuse disc bulge or a broad-based central disc protrusion at L5-S1. This is incompletely evaluated on this examination. It should also be noted that accurate counting is limited on this examination as the entire lumbar spine is not imaged.     Personal reading of MRI does demonstrate mild signs of impingement with cyst within the femoral head.      Assessment and Plan  Janice Oliver was seen today for follow-up.    Diagnoses and all orders for this visit:    Impingement syndrome, hip, right    Right hip pain        Patient is suffering from right hip impingement.  Would recommend and prescribed physical therapy for stretching.  1-2 sessions of therapy for home exercises.  I will have her follow-up with Dr. in 4-6 weeks.  If not doing well at that time consideration for intra-articular cortisone injection.  Special attention for Dr. to reexamine 3D MRI.  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     In terms of treatment, I recommended continuing with rest, icing, avoidance of painful activities, NSAIDs or pain meds as tolerated, and physical therapy.     If these are not effective, cortisone injection can be considered.    We discussed surgical options as well, should conservative measures fail.    Electronically signed by Phoebe PerchJames S Dianne Whelchel, PA-C on 03/17/2020 at 2:59 PM  This dictation was generated by voice recognition computer software.  Although all attempts are made to edit the dictation for accuracy, there may be errors in the transcription that are not intended.

## 2020-04-22 ENCOUNTER — Encounter: Attending: Physician Assistant | Primary: Acute Care

## 2020-04-22 ENCOUNTER — Inpatient Hospital Stay: Admit: 2020-04-22 | Payer: BLUE CROSS/BLUE SHIELD | Primary: Acute Care

## 2020-04-22 DIAGNOSIS — M25851 Other specified joint disorders, right hip: Secondary | ICD-10-CM

## 2020-04-22 NOTE — Other (Signed)
[facility], [place] [address]  Phone: [phone]   Fax:     [phone]      Physical Therapy Treatment Note/ Progress Report:     Date:  04/22/2020    Patient Name:  Janice Oliver    DOB:  1980-04-26  MRN: [id number]  Restrictions/Precautions:    Medical/Treatment Diagnosis Information:  ?? Diagnosis: M25.851 (ICD-10-CM) - Impingement syndrome, hip, right  ?? Treatment Diagnosis: R hip pain  Insurance/Certification information:  PT Insurance Information: BCBS $0 CP NO AUTH 60 visits 80/20  Physician Information:  Referring Practitioner: Merla Riches, PA-C  Has the plan of care been signed (Y/N):        []   Yes  [x]   No     Date of Patient follow up with Physician: TBD    Is this a Progress Report:     []   Yes  [x]   No      If Yes:  Date Range for reporting period:  Beginning: 04/22/20 ------------ Ending: 05/23/20    Progress report will be due (10 Rx or 30 days whichever is less): 05/25/81       Recertification will be due (POC Duration  / 90 days whichever is less): 07/21/20        Visit # Insurance Allowable Auth Required   In Person 1 60 []   Yes     [x]   No    Tele Health   []   Yes     []   No    Total 1       Functional Scale: LEFS 0%    Date assessed:  04/22/20      Latex Allergy:  [x] NO      [] YES  Preferred Language for Healthcare:   [x] English       [] other:    Pain level:  1-3/10     SUBJECTIVE:  See eval    OBJECTIVE: See eval  ??? Observation:   ??? Test measurements:      RESTRICTIONS/PRECAUTIONS: none    Exercises/Interventions:   Therapeutic Ex (97110) Sets/sec Reps Notes/CUES HEP   SL bridge 3 10     Hip flexor stretch S, half kneel 20'' 3     Resisted clamshells 3 10 Red versa    Reverse clamshells 3 10                                                             Manual Intervention (97140)       Hip mobs inferior   Long axis traction   Lateral NV 5'                                         NMR re-education (41962)    CUES NEEDED  Therapeutic Activity [phone])                                          Medbridge access code: [email or web address]           Therapeutic Exercise and NMR EXR  [x]  (97110) Provided verbal/tactile cueing for activities related to strengthening, flexibility, endurance, ROM for improvements in LE, proximal hip, and core control with self care, mobility, lifting, ambulation.  [x]  [phone]) Provided verbal/tactile cueing for activities related to improving balance, coordination, kinesthetic sense, posture, motor skill, proprioception to assist with LE, proximal hip, and core control in self-care, mobility, lifting, ambulation and eccentric single leg control.     NMR and Therapeutic Activities:    [x]  [phone] or 97530) Provided verbal/tactile cueing for activities related to improving balance, coordination, kinesthetic sense, posture, motor skill, proprioception and motor activation to allow for proper function of core, proximal hip and LE with self-care and ADLs and functional mobility.   []  [phone]) Gait Re-education- Provided training and instruction to the patient for proper LE, core and proximal hip recruitment and positioning and eccentric body weight control with ambulation re-education including up and down stairs     Home Exercise Program:    [x]  (61683) Reviewed/Progressed HEP activities related to strengthening, flexibility, endurance, ROM of core, proximal hip and LE for functional self-care, mobility, lifting and ambulation/stair navigation   []  (72902) Reviewed/Progressed HEP activities related to improving balance, coordination, kinesthetic sense, posture, motor skill, proprioception of core, proximal hip and LE for self-care, mobility, lifting, and ambulation/stair navigation      Manual Treatments:  PROM / STM / Oscillations-Mobs:  G-I, II, III, IV (PA's, Inf., Post.)  [x]  (97140) Provided manual therapy to mobilize LE, proximal hip and/or LS  spine soft tissue/joints for the purpose of modulating pain, promoting relaxation, increasing ROM, reducing/eliminating soft tissue swelling/inflammation/restriction, improving soft tissue extensibility and allowing for proper ROM for normal function with self-care, mobility, lifting and ambulation.     Modalities:     [x]  GAME READY (VASO)- for significant edema, swelling, pain control.     Charges:  Timed Code Treatment Minutes: 25   Total Treatment Minutes:  45   BWC:  TE TIME:  NMR TIME:  MANUAL TIME:  UNTIMED MINUTES:  Medicare Total:   10  10  5  20         [x]  EVAL (LOW) 97161 (typically 20 minutes face-to-face)  []  EVAL (MOD) 11155 (typically 30 minutes face-to-face)  []  EVAL (HIGH) 97163 (typically 45 minutes face-to-face)  []  RE-EVAL     [x]  MC(80223) x 1    []  IONTO  [x]  NMR (36122) x 1    []  VASO  []  Manual (97140) x     []  Other:  []  TA x      []  Mech Traction (44975)  []  ES(attended) (30051)      []  ES (un) (10211):    ASSESSMENT:  See eval    GOALS:   Patient stated goal: Pt would like to return to pain free recreational activities.     Therapist goals for Patient:   Short Term Goals: To be achieved in: 2 weeks  1. Independent in HEP and progression per patient tolerance, in order to prevent re-injury.   [x]  Progressing: []  Met: []  Not Met: []  Adjusted     2. Patient will have a decrease in pain to facilitate  improvement in movement, function, and ADLs as indicated by Functional Deficits.  [x]  Progressing: []  Met: []  Not Met: []  Adjusted     Long Term Goals: To be achieved in: 12 weeks  1. Disability index score of 10% or less for the LEFS to assist with reaching prior level of function.   [x]  Progressing: []  Met: []  Not Met: []  Adjusted     2. Patient will demonstrate increased AROM  to allow for proper joint functioning as indicated by patients Functional Deficits.   [x]  Progressing: []  Met: []  Not Met: []  Adjusted     3. Patient will demonstrate an increase in Strength to good proximal hip strength  and control, within 5lb HHD in LE to allow for proper functional mobility as indicated by patients Functional Deficits.   [x]  Progressing: []  Met: []  Not Met: []  Adjusted     4. Patient will return to all functional activities without increased symptoms or restriction.   [x]  Progressing: []  Met: []  Not Met: []  Adjusted     5. Pt will demonstrate the ability to perform all ADL's with 0/10 pain. (patient specific functional goal)    [x]  Progressing: []  Met: []  Not Met: []  Adjusted          Overall Progression Towards Functional goals/ Treatment Progress Update:  []  Patient is progressing as expected towards functional goals listed.    []  Progression is slowed due to complexities/Impairments listed.  []  Progression has been slowed due to co-morbidities.  [x]  Plan just implemented, too soon to assess goals progression <30days   []  Goals require adjustment due to lack of progress  []  Patient is not progressing as expected and requires additional follow up with physician  []  Other    Prognosis for POC: [x]  Good []  Fair  []  Poor      Patient requires continued skilled intervention: [x]  Yes  []  No    Treatment/Activity Tolerance:  [x]  Patient able to complete treatment  []  Patient limited by fatigue  []  Patient limited by pain    []  Patient limited by other medical complications  []  Other:     Return to Play: (if applicable)   []   Stage 1: Intro to Strength   []   Stage 2: Return to Run and Strength   []   Stage 3: Return to Jump and Strength   []   Stage 4: Dynamic Strength and Agility   []   Stage 5: Sport Specific Training     []   Ready to Return to Play, Meets All Above Stages   []   Not Ready for Return to Sports   Comments:                          PLAN: See eval  []  Continue per plan of care []  Alter current plan (see comments above)  [x]  Plan of care initiated []  Hold pending MD visit []  Discharge    Electronically signed by:  Kerby Nora, PT    Note: If patient does not return for scheduled/ recommended follow up  visits, this note will serve as a discharge from care along with most recent update on progress.

## 2020-04-22 NOTE — Plan of Care (Signed)
[facility], [place] [address]  Phone: [phone]   Fax:     [phone]       Physical Therapy Certification    Dear Referring Practitioner: Merla Riches, PA-C,    We had the pleasure of evaluating the following patient for physical therapy services at [facility].  A summary of our findings can be found in the initial assessment below.  This includes our plan of care.  If you have any questions or concerns regarding these findings, please do not hesitate to contact me at the office phone number checked above.  Thank you for the referral.       Physician Signature:_______________________________Date:__________________  By signing above (or electronic signature), therapist???s plan is approved by physician      Patient: Janice Oliver   DOB: June 30, 1980   MRN: [id number]  Referring Physician: Referring Practitioner: Merla Riches, PA-C      Evaluation Date: 04/22/2020      Medical Diagnosis Information:  Diagnosis: M25.851 (ICD-10-CM) - Impingement syndrome, hip, right   Treatment Diagnosis: R hip pain                                         Insurance information: PT Insurance Information: BCBS $0 CP NO AUTH 60 visits 80/20     Precautions/ Contra-indications: none      C-SSRS Triggered by Intake questionnaire (Past 2 wk assessment):   [x] No, Questionnaire did not trigger screening.   [] Yes, Patient intake triggered further evaluation      [] C-SSRS Screening completed  [] PCP notified via Plan of Care  [] Emergency services notified     Latex Allergy:  [x]NO      []YES  Preferred Language for Healthcare:   [x]English       []other:    SUBJECTIVE: Patient stated complaint: Pt reports R hip pain for approximately 2 years with insidious onset. 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 Pt states her pain is more tightness and discomfort in the anterior aspect of her R hip.      Relevant Medical History:na  Functional Disability Index:  LEFS 0%    Pain Scale: 1-3/10  Easing factors: rest  Provocative factors: prolonged activity     Type: []Constant   []Intermittent  []Radiating []Localized []other:     Numbness/Tingling: none    Occupation/School: daycare work    Games developer Level of Function: Independent with ADLs and IADLs,     OBJECTIVE:     ROM LEFT RIGHT   HIP Flex WFL all planes WFL all planes   HIP Abd     HIP Ext     HIP IR     HIP ER     Knee ext     Knee Flex     Ankle PF     Ankle DF     Ankle In     Ankle Ev     Strength  LEFT RIGHT   HIP Flexors 5/5 all planes 5/5    HIP Abductors  4/5   HIP Ext     Hip ER     Knee EXT (quad) 5/5 5/5  Knee Flex (HS) 5/5 5/5   Ankle DF     Ankle PF     Ankle Inv     Ankle EV          Circumference  Mid apex  7 cm prox             Reflexes/Sensation:    [x]Dermatomes/Myotomes intact    [x]Reflexes equal and normal bilaterally   []Other:    Joint mobility: R hip   []Normal    [x]Hypo   []Hyper    Palpation: unremarkable     Functional Mobility/Transfers: normal    Posture: normal    Bandages/Dressings/Incisions: na    Gait: (include devices/WB status) normal    Orthopedic Special Tests: na                       [x] Patient history, allergies, meds reviewed. Medical chart reviewed. See intake form.     Review Of Systems (ROS):  [x]Performed Review of systems (Integumentary, CardioPulmonary, Neurological) by intake and observation. Intake form has been scanned into medical record. Patient has been instructed to contact their primary care physician regarding ROS issues if not already being addressed at this time.      Co-morbidities/Complexities (which will affect course of rehabilitation):   [x]None           Arthritic conditions   []Rheumatoid arthritis (M05.9)  []Osteoarthritis (M19.91)    Cardiovascular conditions   []Hypertension (I10)  []Hyperlipidemia (E78.5)  []Angina pectoris (I20)  []Atherosclerosis (I70)   Musculoskeletal conditions   []Disc pathology   []Congenital spine pathologies   []Prior surgical intervention  []Osteoporosis (M81.8)  []Osteopenia (M85.8)   Endocrine conditions   []Hypothyroid (E03.9)  []Hyperthyroid Gastrointestinal conditions   []Constipation (E52.77)   Metabolic conditions   []Morbid obesity (E66.01)  []Diabetes type 1(E10.65) or 2 (E11.65)   []Neuropathy (G60.9)     Pulmonary conditions   []Asthma (J45)  []Coughing   []COPD (J44.9)   Psychological Disorders  []Anxiety (F41.9)  []Depression (F32.9)   []Other:   []Other:          Barriers to/and or personal factors that will affect rehab potential:              []Age  []Sex              []Motivation/Lack of Motivation                        []Co-Morbidities              []Cognitive Function, education/learning barriers              []Environmental, home barriers              []profession/work barriers  []past PT/medical experience  []other:  Justification:     Falls Risk Assessment (30 days):   [x] Falls Risk assessed and no intervention required.  [] Falls Risk assessed and Patient requires intervention due to being higher risk   TUG score (>12s at risk):     [] Falls education provided, including       G-Codes:       ASSESSMENT:   Functional Impairments:     []Noted lumbar/proximal hip/LE joint hypomobility   []Decreased LE functional ROM   [x]Decreased core/proximal hip strength and neuromuscular control   [x]Decreased LE functional strength   []Reduced balance/proprioceptive control   []other:      Functional Activity Limitations (  from functional questionnaire and intake)   []Reduced ability to tolerate prolonged functional positions   []Reduced ability or difficulty with changes of positions or transfers between positions   []Reduced ability to maintain good posture and demonstrate good body mechanics with sitting,  bending, and lifting   []Reduced ability to sleep   [] Reduced ability or tolerance with driving and/or computer work   [x]Reduced ability to perform lifting, carrying tasks   [x]Reduced ability to squat   []Reduced ability to forward bend   []Reduced ability to ambulate prolonged functional periods/distances/surfaces   []Reduced ability to ascend/descend stairs   [x]Reduced ability to run, hop, cut or jump   []other:    Participation Restrictions   [x]Reduced participation in self care activities   [x]Reduced participation in home management activities   [x]Reduced participation in work activities   [x]Reduced participation in social activities.   [x]Reduced participation in sport/recreation activities.    Classification :    []Signs/symptoms consistent with post-surgical status including decreased ROM, strength and function.   []Signs/symptoms consistent with joint sprain/strain   []Signs/symptoms consistent with patella-femoral syndrome   []Signs/symptoms consistent with knee OA/hip OA   []Signs/symptoms consistent with internal derangement of knee/Hip   [x]Signs/symptoms consistent with functional hip weakness/NMR control      [x]Signs/symptoms consistent with tendinitis/tendinosis    []signs/symptoms consistent with pathology which may benefit from Dry needling      []other:      Prognosis/Rehab Potential:      []Excellent   [x]Good    []Fair   []Poor    Tolerance of evaluation/treatment:    []Excellent   [x]Good    []Fair   []Poor    Physical Therapy Evaluation Complexity Justification  [x] A history of present problem with:  [x] no personal factors and/or comorbidities that impact the plan of care;  []1-2 personal factors and/or comorbidities that impact the plan of care  []3 personal factors and/or comorbidities that impact the plan of care  [x] An examination of body systems using standardized tests and measures addressing any of the following: body structures and functions (impairments), activity  limitations, and/or participation restrictions;:  [] a total of 1-2 or more elements   [x] a total of 3 or more elements   [] a total of 4 or more elements   [x] A clinical presentation with:  [x] stable and/or uncomplicated characteristics   [] evolving clinical presentation with changing characteristics  [] unstable and unpredictable characteristics;   [x] Clinical decision making of [x] low, [] moderate, [] high complexity using standardized patient assessment instrument and/or measurable assessment of functional outcome.    [x] EVAL (LOW) 97161 (typically 20 minutes face-to-face)  [] EVAL (MOD) 15400 (typically 30 minutes face-to-face)  [] EVAL (HIGH) 97163 (typically 45 minutes face-to-face)  [] RE-EVAL     PLAN:   Frequency/Duration:  2 days per week for 12 Weeks:  Interventions:  [x]  Therapeutic exercise including: strength training, ROM, for Lower extremity and core   [x]  NMR activation and proprioception for LE, Glutes and Core   [x]  Manual therapy as indicated for LE, Hip and spine to include: Dry Needling/IASTM, STM, PROM, Gr I-IV mobilizations, manipulation.   [x] Modalities as needed that may include: thermal agents, E-stim, Biofeedback, [place], iontophoresis as indicated  [x] Patient education on joint protection, postural re-education, activity modification, progression of HEP.    HEP instruction: Refer to Gerrard access code and exercises on the 1st visit treatment note  GOALS:  Patient stated goal: Pt would like to return to pain free recreational activities.     Therapist goals for Patient:   Short Term Goals: To be achieved in: 2 weeks  1. Independent in HEP and progression per patient tolerance, in order to prevent re-injury.   [x] Progressing: [] Met: [] Not Met: [] Adjusted     2. Patient will have a decrease in pain to facilitate improvement in movement, function, and ADLs as indicated by Functional Deficits.  [x] Progressing: [] Met: [] Not Met: [] Adjusted     Long Term Goals: To be  achieved in: 12 weeks  1. Disability index score of 10% or less for the LEFS to assist with reaching prior level of function.   [x] Progressing: [] Met: [] Not Met: [] Adjusted     2. Patient will demonstrate increased AROM  to allow for proper joint functioning as indicated by patients Functional Deficits.   [x] Progressing: [] Met: [] Not Met: [] Adjusted     3. Patient will demonstrate an increase in Strength to good proximal hip strength and control, within 5lb HHD in LE to allow for proper functional mobility as indicated by patients Functional Deficits.   [x] Progressing: [] Met: [] Not Met: [] Adjusted     4. Patient will return to all functional activities without increased symptoms or restriction.   [x] Progressing: [] Met: [] Not Met: [] Adjusted     5. Pt will demonstrate the ability to perform all ADL's with 0/10 pain. (patient specific functional goal)    [x] Progressing: [] Met: [] Not Met: [] Adjusted      Electronically signed by:  Kerby Nora, PT

## 2020-04-22 NOTE — Progress Notes (Signed)
[facility], [place]  [address] [place], [place] [address]  Phone: [phone]   Fax: [phone]    Date: 04/22/2020          Patient Name; DOB:  Janice Oliver; [birth year]   Dx: Diagnosis: M25.851 (ICD-10-CM) - Impingement syndrome, hip, right      Physician: Referring Practitioner: Doreen Beam, PA-C        Total PT Visits:      Measures Previous Current   Pain (0-10)     Disability %     ROM               Strength                 Specific Functional Improvements & Impressions:      Plan & Recommendations:  []  Continue rehabilitation due to objective improvement and continued functional deficits with frequency and duration:  []  Progress toward  [] GAP, [] Work Conditioning, [] Independent HEP   []  Discharge due to   []  All goals achieved, []  Maximized "medical necessity" []  No subjective or objective improvements      Electronically signed by:  , PT  Therapy Plan of Care Re-Certification  This patient has been re-evaluated for physical therapy services and for therapy to continue, Medicare, Medicaid and other insurances require periodic physician review of the treatment plan. Please review the above re-evaluation and verify that you agree with plan of care as established above by signing the attached document and return it to our office or note changes to established plan below  []  Follow treatment plan as above []  Discontinue physical therapy  []  Change plan to:                                 __________________________________________________    Physician Signature:____________________________________ Date:____________  By signing above, therapist???s plan is approved by physician    If you have any questions or concerns, please don't hesitate to call.  Thank you for your referral.

## 2020-04-24 ENCOUNTER — Telehealth: Admit: 2020-04-24 | Discharge: 2020-04-24 | Payer: BLUE CROSS/BLUE SHIELD | Attending: Acute Care | Primary: Acute Care

## 2020-04-24 DIAGNOSIS — J019 Acute sinusitis, unspecified: Secondary | ICD-10-CM

## 2020-04-24 MED ORDER — GUAIFENESIN ER 600 MG PO TB12
600 MG | ORAL_TABLET | Freq: Two times a day (BID) | ORAL | 0 refills | Status: AC
Start: 2020-04-24 — End: 2020-05-09

## 2020-04-24 MED ORDER — AZELASTINE HCL 0.1 % NA SOLN
0.1 % | Freq: Two times a day (BID) | NASAL | 1 refills | Status: AC
Start: 2020-04-24 — End: 2023-02-09

## 2020-04-24 MED ORDER — AMOXICILLIN-POT CLAVULANATE 875-125 MG PO TABS
875-125 MG | ORAL_TABLET | Freq: Two times a day (BID) | ORAL | 0 refills | Status: AC
Start: 2020-04-24 — End: 2020-05-01

## 2020-04-24 NOTE — Progress Notes (Signed)
04/24/2020    TELEHEALTH EVALUATION -- Audio/Visual (During BJSEG-31 public health emergency)    HPI:    Janice Oliver (DOB:  Oct 01, 1980) has requested an audio/video evaluation for the following concern(s):    Chief Complaint   Patient presents with   ??? Cough     wet   ??? Congestion   ??? Otalgia     left ear. clogged itchy, sharp shooting pain up left side of face   ??? Pharyngitis   ??? Other     x 1 week       Janice Oliver is seen today for evaluation of sinusitis symptoms.  Her symptoms started before Christmas with a viral illness.  That essentially resolved and then after Christmas she started having repeat URI symptoms this time without fever.  She notes cough and congestion, sore throat, sinus pressure, left sided otalgia, postnasal drip and a productive cough of yellow mucus.  She has not been running fevers.  She has tested negative for COVID.  She has used Human resources officer, Robitussin and her allergy medications including saline rinses none of these have been effective.  Her current symptoms have been ongoing for almost 14 days now.        Review of Systems   Constitutional: Positive for fatigue. Negative for activity change, chills, fever and unexpected weight change.   HENT: Positive for congestion, ear pain, postnasal drip, rhinorrhea, sinus pressure and sore throat. Negative for drooling, ear discharge, sneezing, tinnitus and trouble swallowing.    Eyes: Negative for redness, itching and visual disturbance.   Respiratory: Positive for cough. Negative for chest tightness, shortness of breath and wheezing.    Cardiovascular: Negative for chest pain, palpitations and leg swelling.   Gastrointestinal: Negative for abdominal pain, diarrhea, nausea and vomiting.   Endocrine: Negative.    Genitourinary: Negative.    Musculoskeletal: Negative.    Skin: Negative.    Allergic/Immunologic: Positive for environmental allergies. Negative for food allergies and immunocompromised state.   Neurological: Negative for dizziness, syncope,  weakness and headaches.   Hematological: Negative.    Psychiatric/Behavioral: Negative.        Prior to Visit Medications    Medication Sig Taking? Authorizing Provider   valACYclovir (VALTREX) 1 g tablet Take two tablets every 12 hours for 1 day. Yes Maricela Curet, APRN - CNP   naproxen (NAPROSYN) 500 MG tablet TAKE 1 TABLET BY MOUTH DAILY AS NEEDED FOR MILD PAIN (1 3). Yes Historical Provider, MD   escitalopram (LEXAPRO) 5 MG tablet TAKE 1 TABLET BY MOUTH EVERY DAY Yes Historical Provider, MD       Social History     Tobacco Use   ??? Smoking status: Never Smoker   ??? Smokeless tobacco: Never Used   Substance Use Topics   ??? Alcohol use: Yes     Comment: occasional   ??? Drug use: Never            PHYSICAL EXAMINATION:  No flowsheet data found.      Physical Exam  Constitutional:       General: She is not in acute distress.     Appearance: Normal appearance. She is normal weight. She is not ill-appearing, toxic-appearing or diaphoretic.   HENT:      Head: Normocephalic and atraumatic.      Right Ear: External ear normal.      Left Ear: External ear normal.      Nose: Congestion (Very nasally congested) present. No rhinorrhea.  Mouth/Throat:      Pharynx: Oropharynx is clear.   Eyes:      General:         Right eye: No discharge.         Left eye: No discharge.      Conjunctiva/sclera: Conjunctivae normal.   Neck:      Trachea: Phonation normal.   Pulmonary:      Effort: Pulmonary effort is normal. No respiratory distress.      Comments: Respirations easy and even, able to speak in complete sentences without shortness of breath or audible wheezing    Musculoskeletal:      Cervical back: Normal range of motion.   Skin:     Coloration: Skin is not jaundiced or pale.   Neurological:      General: No focal deficit present.      Mental Status: She is alert and oriented to person, place, and time.   Psychiatric:         Mood and Affect: Mood normal.         Behavior: Behavior normal.         Thought Content: Thought  content normal.         Judgment: Judgment normal.           ASSESSMENT/PLAN:    ICD-10-CM    1. Acute sinusitis, recurrence not specified, unspecified location  J01.90      Push fluids  Continue allergy management  Add Mucinex and  Astelin nasal spray  Start Augmentin  Report if fails to improve    Orders Placed This Encounter   Medications   ??? amoxicillin-clavulanate (AUGMENTIN) 875-125 MG per tablet     Sig: Take 1 tablet by mouth 2 times daily for 7 days     Dispense:  14 tablet     Refill:  0   ??? guaiFENesin (MUCINEX) 600 MG extended release tablet     Sig: Take 1 tablet by mouth 2 times daily for 15 days     Dispense:  30 tablet     Refill:  0   ??? azelastine (ASTELIN) 0.1 % nasal spray     Sig: 1 spray by Nasal route 2 times daily Use in each nostril as directed     Dispense:  60 mL     Refill:  1     No orders of the defined types were placed in this encounter.        Return if symptoms worsen or fail to improve.    Janice Oliver is a 40 y.o. female being evaluated by a Virtual Visit (video visit) encounter to address concerns as mentioned above.  A caregiver was present when appropriate. Due to this being a Scientist, research (medical) (During COVID-19 public health emergency), evaluation of the following organ systems was limited: Vitals/Constitutional/EENT/Resp/CV/GI/GU/MS/Neuro/Skin/Heme-Lymph-Imm.  Pursuant to the emergency declaration under the [facility] Act and the [employer], 1135 waiver authority and the [employer] and [employer] Act, this Virtual Visit was conducted with patient's (and/or legal guardian's) consent, to reduce the patient's risk of exposure to COVID-19 and provide necessary medical care.  The patient (and/or legal guardian) has also been advised to contact this office for worsening conditions or problems, and seek emergency medical treatment and/or call 911 if deemed necessary.     Patient identification was verified at the start of the  visit: Yes    Total time spent on this encounter: Not billed by time    Services  were provided through a video synchronous discussion virtually to substitute for in-person clinic visit. Patient and provider were located at their individual homes.    --Doran Clay, APRN - CNP on 04/24/2020 at 1:58 PM    An electronic signature was used to authenticate this note.

## 2020-05-01 ENCOUNTER — Inpatient Hospital Stay: Admit: 2020-05-01 | Payer: BLUE CROSS/BLUE SHIELD | Primary: Acute Care

## 2020-05-01 NOTE — Other (Signed)
[facility], [place] [address]  Phone: [phone]   Fax:     [phone]      Physical Therapy Treatment Note/ Progress Report:     Date:  05/01/2020    Patient Name:  Janice Oliver    DOB:  [birth year]  MRN: [id number]  Restrictions/Precautions:    Medical/Treatment Diagnosis Information:  ?? Diagnosis: M25.851 (ICD-10-CM) - Impingement syndrome, hip, right  ?? Treatment Diagnosis: R hip pain  Insurance/Certification information:  PT Insurance Information: BCBS $0 CP NO AUTH 60 visits 80/20  Physician Information:  Referring Practitioner: Merla Riches, PA-C  Has the plan of care been signed (Y/N):        [x]  Yes  []  No     Date of Patient follow up with Physician: TBD    Is this a Progress Report:     []  Yes  [x]  No      If Yes:  Date Range for reporting period:  Beginning: 04/22/20 ------------ Ending: 05/23/20     Progress report will be due (10 Rx or 30 days whichever is less): 5/0/27       Recertification will be due (POC Duration  / 90 days whichever is less): 07/21/20        Visit # Insurance Allowable Auth Required   In Person 2 60 []  Yes     [x]  No    Tele Health   []  Yes     []  No    Total 2       Functional Scale: LEFS 0%    Date assessed:  04/22/20      Latex Allergy:  [x]NO      []YES  Preferred Language for Healthcare:   [x]English       []other:    Pain level:  1/10     SUBJECTIVE:  Pt reports good relief from symptoms after last session but her pain returned.      OBJECTIVE:   ??? Observation:   ??? Test measurements:      RESTRICTIONS/PRECAUTIONS: none    Exercises/Interventions:   Therapeutic Ex (74128) Sets/sec Reps Notes/CUES HEP   bike 6'      SL bridge 3 10     Hip flexor stretch S, half kneel 20'' 3     Resisted clamshells 3 10 Red versa    Reverse clamshells 3 10                                                             Manual Intervention (97140)       Hip mobs inferior   Long axis traction    Lateral    20'                                         NMR re-education [phone])   CUES NEEDED  Therapeutic Activity [phone])                                          Medbridge access code: [email or web address]           Therapeutic Exercise and NMR EXR  [x] (76283) Provided verbal/tactile cueing for activities related to strengthening, flexibility, endurance, ROM for improvements in LE, proximal hip, and core control with self care, mobility, lifting, ambulation.  [x] [phone]) Provided verbal/tactile cueing for activities related to improving balance, coordination, kinesthetic sense, posture, motor skill, proprioception to assist with LE, proximal hip, and core control in self-care, mobility, lifting, ambulation and eccentric single leg control.     NMR and Therapeutic Activities:    [x] (16073 or 71062) Provided verbal/tactile cueing for activities related to improving balance, coordination, kinesthetic sense, posture, motor skill, proprioception and motor activation to allow for proper function of core, proximal hip and LE with self-care and ADLs and functional mobility.   [] (69485) Gait Re-education- Provided training and instruction to the patient for proper LE, core and proximal hip recruitment and positioning and eccentric body weight control with ambulation re-education including up and down stairs     Home Exercise Program:    [x] (46270) Reviewed/Progressed HEP activities related to strengthening, flexibility, endurance, ROM of core, proximal hip and LE for functional self-care, mobility, lifting and ambulation/stair navigation   [] (35009) Reviewed/Progressed HEP activities related to improving balance, coordination, kinesthetic sense, posture, motor skill, proprioception of core, proximal hip and LE for self-care, mobility, lifting, and ambulation/stair navigation      Manual Treatments:  PROM / STM / Oscillations-Mobs:  G-I, II, III, IV (PA's, Inf.,  Post.)  [x] (38182) Provided manual therapy to mobilize LE, proximal hip and/or LS spine soft tissue/joints for the purpose of modulating pain, promoting relaxation, increasing ROM, reducing/eliminating soft tissue swelling/inflammation/restriction, improving soft tissue extensibility and allowing for proper ROM for normal function with self-care, mobility, lifting and ambulation.     Modalities:     [x] GAME READY (VASO)- for significant edema, swelling, pain control.     Charges:  Timed Code Treatment Minutes: 38   Total Treatment Minutes:  38   BWC:  TE TIME:  NMR TIME:  MANUAL TIME:  UNTIMED MINUTES:  Medicare Total:   _0 [] EVAL (LOW) 97161 (typically 20 minutes face-to-face)  [] EVAL (MOD) 99371 (typically 30 minutes face-to-face)  [] EVAL (HIGH) 97163 (typically 45 minutes face-to-face)  [] RE-EVAL     [x] IR(67893) x 1    [] IONTO  [x] NMR (81017) x 1    [] VASO  [x] Manual (51025) x 1     [] Other:  [] TA x      [] Mech Traction (85277)  [] ES(attended) (82423)      [] ES (un) (53614):    ASSESSMENT:  See eval    GOALS:   Patient stated goal: Pt would like to return to pain free recreational activities.     Therapist goals for Patient:   Short Term Goals: To be achieved in: 2 weeks  1. Independent in HEP and progression per patient tolerance, in order to prevent re-injury.   [x] Progressing: [] Met: [] Not Met: [] Adjusted     2. Patient will have a decrease in pain to  facilitate improvement in movement, function, and ADLs as indicated by Functional Deficits.  [x] Progressing: [] Met: [] Not Met: [] Adjusted     Long Term Goals: To be achieved in: 12 weeks  1. Disability index score of 10% or less for the LEFS to assist with reaching prior level of function.   [x] Progressing: [] Met: [] Not Met: [] Adjusted     2. Patient will demonstrate increased AROM  to allow for proper joint functioning as indicated by patients Functional Deficits.   [x] Progressing: [] Met: [] Not Met: [] Adjusted      3. Patient will demonstrate an increase in Strength to good proximal hip strength and control, within 5lb HHD in LE to allow for proper functional mobility as indicated by patients Functional Deficits.   [x] Progressing: [] Met: [] Not Met: [] Adjusted     4. Patient will return to all functional activities without increased symptoms or restriction.   [x] Progressing: [] Met: [] Not Met: [] Adjusted     5. Pt will demonstrate the ability to perform all ADL's with 0/10 pain. (patient specific functional goal)    [x] Progressing: [] Met: [] Not Met: [] Adjusted          Overall Progression Towards Functional goals/ Treatment Progress Update:  [] Patient is progressing as expected towards functional goals listed.    [] Progression is slowed due to complexities/Impairments listed.  [] Progression has been slowed due to co-morbidities.  [x] Plan just implemented, too soon to assess goals progression <30days   [] Goals require adjustment due to lack of progress  [] Patient is not progressing as expected and requires additional follow up with physician  [] Other    Prognosis for POC: [x] Good [] Fair  [] Poor      Patient requires continued skilled intervention: [x] Yes  [] No    Treatment/Activity Tolerance:  [x] Patient able to complete treatment  [] Patient limited by fatigue  [] Patient limited by pain    [] Patient limited by other medical complications  [] Other:     Return to Play: (if applicable)   []  Stage 1: Intro to Strength   []  Stage 2: Return to Run and Strength   []  Stage 3: Return to Jump and Strength   []  Stage 4: Dynamic Strength and Agility   []  Stage 5: Copy     []  Ready to Return to Play, Meets All Above Stages   []  Not Ready for Return to Sports   Comments:                          PLAN: See eval  [] Continue per plan of care [] Alter current plan (see comments above)  [x] Plan of care initiated [] Hold pending MD visit [] Discharge    Electronically signed by:  Kerby Nora, PT    Note: If patient does not return for scheduled/ recommended follow up visits, this note will serve as a discharge from care along with most recent update on progress.

## 2020-05-08 ENCOUNTER — Encounter: Payer: BLUE CROSS/BLUE SHIELD | Primary: Acute Care

## 2020-05-12 MED ORDER — ESCITALOPRAM OXALATE 5 MG PO TABS
5 MG | ORAL_TABLET | ORAL | 2 refills | Status: DC
Start: 2020-05-12 — End: 2020-08-05

## 2020-05-12 NOTE — Telephone Encounter (Signed)
Patient called  Refill  30 day  Lexapro 5 mg with refills    Pharmacy is [facility]    04/24/2020 last ov  Future Appointments   Date Time Provider Department Center   05/15/2020  5:15 PM Ellyn Hack, PT [facility] PT [facility] HOD

## 2020-05-15 ENCOUNTER — Inpatient Hospital Stay: Admit: 2020-05-15 | Payer: BLUE CROSS/BLUE SHIELD | Primary: Acute Care

## 2020-05-15 NOTE — Other (Signed)
[facility], [place] [address]  Phone: [phone]   Fax:     [phone]      Physical Therapy Treatment Note/ Progress Report:     Date:  05/15/2020    Patient Name:  Janice Oliver    DOB:  11/10/1980  MRN: [id number]  Restrictions/Precautions:    Medical/Treatment Diagnosis Information:  ?? Diagnosis: M25.851 (ICD-10-CM) - Impingement syndrome, hip, right  ?? Treatment Diagnosis: R hip pain  Insurance/Certification information:  PT Insurance Information: BCBS $0 CP NO AUTH 60 visits 80/20  Physician Information:  Referring Practitioner: Merla Riches, PA-C  Has the plan of care been signed (Y/N):        [x]   Yes  []   No     Date of Patient follow up with Physician: TBD    Is this a Progress Report:     []   Yes  [x]   No      If Yes:  Date Range for reporting period:  Beginning: 04/22/20 ------------ Ending: 05/23/20     Progress report will be due (10 Rx or 30 days whichever is less): 09/22/04       Recertification will be due (POC Duration  / 90 days whichever is less): 07/21/20        Visit # Insurance Allowable Auth Required   In Person 3 60 []   Yes     [x]   No    Tele Health   []   Yes     []   No    Total 3       Functional Scale: LEFS 0%    Date assessed:  04/22/20      Latex Allergy:  [x] NO      [] YES  Preferred Language for Healthcare:   [x] English       [] other:    Pain level:  1/10     SUBJECTIVE:  Pt reports she gets relief from joint mobs but her pain returns after a day or two.      OBJECTIVE:   ??? Observation:   ??? Test measurements:      RESTRICTIONS/PRECAUTIONS: none    Exercises/Interventions:   Therapeutic Ex (30160) Sets/sec Reps Notes/CUES HEP   bike 6'      SL bridge 3 10     Hip flexor stretch S, half kneel 20'' 3     Resisted clamshells 3 10 Red versa    Reverse clamshells 3 10 orange                                                            Manual Intervention (97140)       Hip mobs inferior   Long axis  traction   Lateral    20'                                         NMR re-education [phone])   CUES NEEDED  Therapeutic Activity [phone])                                          Medbridge access code: [email or web address]           Therapeutic Exercise and NMR EXR  [x]  [phone]) Provided verbal/tactile cueing for activities related to strengthening, flexibility, endurance, ROM for improvements in LE, proximal hip, and core control with self care, mobility, lifting, ambulation.  [x]  [phone]) Provided verbal/tactile cueing for activities related to improving balance, coordination, kinesthetic sense, posture, motor skill, proprioception to assist with LE, proximal hip, and core control in self-care, mobility, lifting, ambulation and eccentric single leg control.     NMR and Therapeutic Activities:    [x]  [phone] or [phone]) Provided verbal/tactile cueing for activities related to improving balance, coordination, kinesthetic sense, posture, motor skill, proprioception and motor activation to allow for proper function of core, proximal hip and LE with self-care and ADLs and functional mobility.   []  [phone]) Gait Re-education- Provided training and instruction to the patient for proper LE, core and proximal hip recruitment and positioning and eccentric body weight control with ambulation re-education including up and down stairs     Home Exercise Program:    [x]  (27062) Reviewed/Progressed HEP activities related to strengthening, flexibility, endurance, ROM of core, proximal hip and LE for functional self-care, mobility, lifting and ambulation/stair navigation   []  (37628) Reviewed/Progressed HEP activities related to improving balance, coordination, kinesthetic sense, posture, motor skill, proprioception of core, proximal hip and LE for self-care, mobility, lifting, and ambulation/stair navigation      Manual Treatments:  PROM / STM / Oscillations-Mobs:  G-I, II, III, IV  (PA's, Inf., Post.)  [x]  (97140) Provided manual therapy to mobilize LE, proximal hip and/or LS spine soft tissue/joints for the purpose of modulating pain, promoting relaxation, increasing ROM, reducing/eliminating soft tissue swelling/inflammation/restriction, improving soft tissue extensibility and allowing for proper ROM for normal function with self-care, mobility, lifting and ambulation.     Modalities:     [x]  GAME READY (VASO)- for significant edema, swelling, pain control.     Charges:  Timed Code Treatment Minutes: 38   Total Treatment Minutes:  38   BWC:  TE TIME:  NMR TIME:  MANUAL TIME:  UNTIMED MINUTES:  Medicare Total:   10  8  20           []  EVAL (LOW) 97161 (typically 20 minutes face-to-face)  []  EVAL (MOD) 31517 (typically 30 minutes face-to-face)  []  EVAL (HIGH) 97163 (typically 45 minutes face-to-face)  []  RE-EVAL     [x]  [place](60737) x 1    []  IONTO  [x]  NMR (10626) x 1    []  VASO  [x]  Manual (97140) x 1     []  Other:  []  TA x      []  Mech Traction (94854)  []  ES(attended) (62703)      []  ES (un) (50093):    ASSESSMENT:  See eval    GOALS:   Patient stated goal: Pt would like to return to pain free recreational activities.     Therapist goals for Patient:   Short Term Goals: To be achieved in: 2 weeks  1. Independent in HEP and progression per patient tolerance, in order to prevent re-injury.   [x]  Progressing: []  Met: []  Not Met: []  Adjusted     2. Patient will have a decrease in pain to  facilitate improvement in movement, function, and ADLs as indicated by Functional Deficits.  [x]  Progressing: []  Met: []  Not Met: []  Adjusted     Long Term Goals: To be achieved in: 12 weeks  1. Disability index score of 10% or less for the LEFS to assist with reaching prior level of function.   [x]  Progressing: []  Met: []  Not Met: []  Adjusted     2. Patient will demonstrate increased AROM  to allow for proper joint functioning as indicated by patients Functional Deficits.   [x]  Progressing: []  Met: []  Not Met: []   Adjusted     3. Patient will demonstrate an increase in Strength to good proximal hip strength and control, within 5lb HHD in LE to allow for proper functional mobility as indicated by patients Functional Deficits.   [x]  Progressing: []  Met: []  Not Met: []  Adjusted     4. Patient will return to all functional activities without increased symptoms or restriction.   [x]  Progressing: []  Met: []  Not Met: []  Adjusted     5. Pt will demonstrate the ability to perform all ADL's with 0/10 pain. (patient specific functional goal)    [x]  Progressing: []  Met: []  Not Met: []  Adjusted          Overall Progression Towards Functional goals/ Treatment Progress Update:  []  Patient is progressing as expected towards functional goals listed.    []  Progression is slowed due to complexities/Impairments listed.  []  Progression has been slowed due to co-morbidities.  [x]  Plan just implemented, too soon to assess goals progression <30days   []  Goals require adjustment due to lack of progress  []  Patient is not progressing as expected and requires additional follow up with physician  []  Other    Prognosis for POC: [x]  Good []  Fair  []  Poor      Patient requires continued skilled intervention: [x]  Yes  []  No    Treatment/Activity Tolerance:  [x]  Patient able to complete treatment  []  Patient limited by fatigue  []  Patient limited by pain    []  Patient limited by other medical complications  []  Other:     Return to Play: (if applicable)   []   Stage 1: Intro to Strength   []   Stage 2: Return to Run and Strength   []   Stage 3: Return to Jump and Strength   []   Stage 4: Dynamic Strength and Agility   []   Stage 5: Sport Specific Training     []   Ready to Return to Play, Meets All Above Stages   []   Not Ready for Return to Sports   Comments:                          PLAN: See eval  []  Continue per plan of care []  Alter current plan (see comments above)  [x]  Plan of care initiated []  Hold pending MD visit []  Discharge    Electronically signed by:   Kerby Nora, PT    Note: If patient does not return for scheduled/ recommended follow up visits, this note will serve as a discharge from care along with most recent update on progress.

## 2020-06-13 NOTE — Telephone Encounter (Signed)
Can she schedule an appointment to discuss her anxiety and medications.  She can get a booster covid shot but she does not need to restart the covid series

## 2020-06-13 NOTE — Telephone Encounter (Signed)
Future Appointments   Date Time Provider Department Center   06/19/2020  4:40 PM Doran Clay, APRN - CNP [facility]     Pt advised.

## 2020-06-13 NOTE — Telephone Encounter (Signed)
Janice Oliver  P [facility]  Subject: Message to Provider     QUESTIONS   Information for Provider? Pt would like to know if she needs to get a new   covid shot after a year or not. She received her first dose in feb of last   year and doesnt know if she needs to get a new vaccine or not   ---------------------------------------------------------------------------   --------------   CALL BACK INFO   What is the best way for the office to contact you? OK to leave message on   voicemail   Preferred Call Back Phone Number? [phone]   ---------------------------------------------------------------------------   --------------   SCRIPT ANSWERS   Relationship to Patient? Self

## 2020-06-13 NOTE — Telephone Encounter (Signed)
-----   Message from Darrelyn Hillock sent at 06/13/2020 10:52 AM EST -----  Subject: Message to Provider    QUESTIONS  Information for Provider? Pt would like to speak with her pcp regarding   anxiety medication. she says that in the past she has taken xanax, which   has worked for her. She needs something to help with her anxiety in april   when she has to fly. She says she has an old bottle of xanax that was   prescribed and she'd like to know if she can take this or if her pcp can   prescribe enough for her while flying   ---------------------------------------------------------------------------  --------------  CALL BACK INFO  What is the best way for the office to contact you? OK to leave message on   voicemail  Preferred Call Back Phone Number? [phone]  ---------------------------------------------------------------------------  --------------  SCRIPT ANSWERS  Relationship to Patient? Self

## 2020-06-19 ENCOUNTER — Telehealth: Admit: 2020-06-19 | Discharge: 2020-06-19 | Payer: BLUE CROSS/BLUE SHIELD | Attending: Acute Care | Primary: Acute Care

## 2020-06-19 ENCOUNTER — Encounter: Admit: 2021-02-05 | Discharge: 2021-02-05 | Payer: BLUE CROSS/BLUE SHIELD | Primary: Acute Care

## 2020-06-19 DIAGNOSIS — F419 Anxiety disorder, unspecified: Secondary | ICD-10-CM

## 2020-06-19 DIAGNOSIS — F411 Generalized anxiety disorder: Secondary | ICD-10-CM

## 2020-06-19 MED ORDER — ALPRAZOLAM 0.5 MG PO TABS
0.5 MG | ORAL_TABLET | Freq: Once | ORAL | 0 refills | Status: AC | PRN
Start: 2020-06-19 — End: 2020-06-19

## 2020-06-19 NOTE — Progress Notes (Signed)
06/19/2020    TELEHEALTH EVALUATION -- Audio/Visual (During COVID-19 public health emergency)    HPI:    Janice Oliver (DOB:  09/06/1980) has requested an audio/video evaluation for the following concern(s):    No chief complaint on file.      [name] seen today to discuss her anxiety.  She was diagnosed with general anxiety disorder in her 30s.  Since that time she feels like her symptoms have been well controlled with Lexapro.  However previously she had a prescription of Xanax for acute panic attacks.  It was initially given for her panic attacks while flying.  She had a last prescription in 2018.  She states that she has an upcoming flight in April to [place] with her children and she is worried about having another panic attack and was wondering if she could get a refill of her Xanax in order to help her with her flight.        Review of Systems   Constitutional: Negative.    Respiratory: Negative.    Cardiovascular: Negative.    Gastrointestinal: Positive for diarrhea, nausea and vomiting.        Acute gastroenteritis, resolved   Neurological: Negative.    Psychiatric/Behavioral: Negative for behavioral problems, self-injury and sleep disturbance. The patient is nervous/anxious.         Generally symptoms well controlled with Lexapro       Prior to Visit Medications    Medication Sig Taking? Authorizing Provider   escitalopram (LEXAPRO) 5 MG tablet TAKE 1 TABLET BY MOUTH EVERY DAY  Doran Clay, APRN - CNP   azelastine (ASTELIN) 0.1 % nasal spray 1 spray by Nasal route 2 times daily Use in each nostril as directed  Doran Clay, APRN - CNP   valACYclovir (VALTREX) 1 g tablet Take two tablets every 12 hours for 1 day.  Doran Clay, APRN - CNP       Social History     Tobacco Use   ??? Smoking status: Never Smoker   ??? Smokeless tobacco: Never Used   Substance Use Topics   ??? Alcohol use: Yes     Comment: occasional   ??? Drug use: Never            PHYSICAL EXAMINATION:  No flowsheet data found.       Physical Exam  Constitutional:       General: She is not in acute distress.     Appearance: Normal appearance. She is ill-appearing. She is not diaphoretic.   HENT:      Head: Normocephalic and atraumatic.      Right Ear: External ear normal.      Left Ear: External ear normal.      Nose: Nose normal. No rhinorrhea.      Mouth/Throat:      Pharynx: Oropharynx is clear.   Eyes:      General:         Right eye: No discharge.         Left eye: No discharge.      Conjunctiva/sclera: Conjunctivae normal.   Neck:      Trachea: Phonation normal.   Pulmonary:      Effort: Pulmonary effort is normal. No respiratory distress.      Comments: Respirations easy and even, able to speak in complete sentences without shortness of breath or audible wheezing  Musculoskeletal:      Cervical back: Normal range of motion.   Skin:  Coloration: Skin is not jaundiced or pale.   Neurological:      General: No focal deficit present.      Mental Status: She is alert and oriented to person, place, and time.   Psychiatric:         Mood and Affect: Mood normal.         Behavior: Behavior normal.         Thought Content: Thought content normal.         Judgment: Judgment normal.           ASSESSMENT/PLAN:    ICD-10-CM    1. Anxiety  F41.9 ALPRAZolam (XANAX) 0.5 MG tablet     Acute gastroenteritis resolving, continue to push fluids and bland diet  Anxiety generally well controlled with Lexapro, OARRS reviewed and 4 tablets of Xanax for flying sent to pharmacy    Orders Placed This Encounter   Medications   ??? ALPRAZolam (XANAX) 0.5 MG tablet     Sig: Take 1 tablet by mouth once as needed for Anxiety (take every 6 hours as needed for flying) for up to 1 dose.     Dispense:  4 tablet     Refill:  0     No orders of the defined types were placed in this encounter.        Return in about 3 months (around 09/19/2020) for annual physical.    Janice Oliver is a 40 y.o. female  was evaluated through a synchronous (real-time) audio-video encounter.  The patient (or guardian if applicable) is aware that this is a billable service, which includes applicable co-pays. This Virtual Visit was conducted with patient's (and/or legal guardian's) consent. The visit was conducted pursuant to the emergency declaration under the [employer] and the [employer], 1135 waiver authority and the [employer] and [employer] Act. Patient identification was verified, and a caregiver was present when appropriate. The patient was located in a state where the provider was licensed to provide care.     Patient identification was verified at the start of the visit: Yes    Total time spent on this encounter: Not billed by time    Services were provided through a video synchronous discussion virtually to substitute for in-person clinic visit. Patient and provider were located at their individual homes.    --Doran Clay, APRN - CNP on 06/19/2020 at 4:41 PM    An electronic signature was used to authenticate this note.

## 2020-08-05 MED ORDER — ESCITALOPRAM OXALATE 5 MG PO TABS
5 MG | ORAL_TABLET | ORAL | 2 refills | Status: DC
Start: 2020-08-05 — End: 2020-10-10

## 2020-08-05 NOTE — Telephone Encounter (Signed)
LOV 06/19/2020    No future appointments.      Please fill in the absence of [name] thank you

## 2020-08-06 MED ORDER — NAPROXEN 500 MG PO TABS
500 MG | ORAL_TABLET | ORAL | 0 refills | Status: DC
Start: 2020-08-06 — End: 2020-09-05

## 2020-08-06 NOTE — Telephone Encounter (Signed)
Marylene Land out of office. Can someone else fill?

## 2020-08-06 NOTE — Telephone Encounter (Signed)
No future appointments.  LOV 06/19/2020

## 2020-08-19 ENCOUNTER — Telehealth
Admit: 2020-08-19 | Discharge: 2020-08-19 | Payer: BLUE CROSS/BLUE SHIELD | Attending: Family Medicine | Primary: Acute Care

## 2020-08-19 DIAGNOSIS — H1032 Unspecified acute conjunctivitis, left eye: Secondary | ICD-10-CM

## 2020-08-19 MED ORDER — TOBRAMYCIN 0.3 % OP SOLN
0.3 % | Freq: Three times a day (TID) | OPHTHALMIC | 0 refills | Status: AC | PRN
Start: 2020-08-19 — End: 2020-08-26

## 2020-08-19 NOTE — Progress Notes (Signed)
TELEHEALTH EVALUATION -- Audio/Visual (During COVID-19 public health emergency)    HPI:  Janice Oliver (DOB:  [birth year]) is a 40 y.o. female,  here for evaluation of the following chief complaint(s):  Eye Problem (Left eye pain and red)      ASSESSMENT/PLAN:   Diagnosis Orders   1. Acute conjunctivitis of left eye, unspecified acute conjunctivitis type       Lexxus was seen today for eye problem.    Diagnoses and all orders for this visit:    Acute conjunctivitis of left eye, unspecified acute conjunctivitis type      -     tobramycin (TOBREX) 0.3 % ophthalmic solution; Place 1 drop into the left eye every 8 hours as needed (redness, pain)    Recommend coming out of left contact lens for 1 week.  Let us know in 1 week if not continuing to improve, would send to ophthalmolgy due to unilateral presentation and c/o pain. It is improving since yesterday..   If redness and pain resolves, ok to stop drop early.         SUBJECTIVE/OBJECTIVE:  HPI  She has had left eye pain and redness for couple of days.  Today is improved from yesterday.  She has no eye discharge.  She has no effect on the right eye.  She is a contact lens wearer.    Review of Systems   As above  Allergic/Immunologic: Negative for immunocompromised state.   Psychiatric/Behavioral: Negative for agitation, behavioral problems and confusion.     Physical Exam    Constitutional: [x]  Appears well-developed and well-nourished [x]  No apparent distress      []  Abnormal-   Mental status  [x]  Alert and awake  [x]  Oriented to person/place/time [x] Able to follow commands      Eyes:  EOM    [x]   Normal  []  Abnormal-  Sclera  [x]   Normal  []  Abnormal -         Discharge [x]   None visible  []  Abnormal -injected vasculature  noticed on the lateral aspect of the left eye  The conjunctiva on the medial side of the left eye is not involved.  The right eye is clear.    HENT:   [x]  Normocephalic, atraumatic.  []  Abnormal   []  Mouth/Throat: Mucous membranes are moist.      External Ears [x]  Normal  []  Abnormal-     Neck: [x]  No visualized mass     Pulmonary/Chest: [x]  Respiratory effort normal.  [x]  No visualized signs of difficulty breathing or respiratory distress        []  Abnormal-    Able to speak in full sentences without difficulty  Musculoskeletal:   []  Normal gait with no signs of ataxia         [x]  Normal range of motion of neck        []  Abnormal-       Neurological:        [x]  No Facial Asymmetry (Cranial nerve 7 motor function) (limited exam to video visit)          [x]  No gaze palsy        []  Abnormal-         Skin:        [x]  No significant exanthematous lesions or discoloration noted on facial skin         []  Abnormal-            Psychiatric:       [  x] Normal Affect []  No Hallucinations        []  Abnormal-   Judgment, behavior, thought and mood are normal.          Time spent today included for this patient visit includes time spent preparing to see the patient  Including review of tests, labs and imaging,   revewing previous history and recent encounters,   obtaining and/or reviewing separately obtained history in care everywhere or record,   performing a medically appropriate examination and/or evaluation;   counseling and educating the patient   ordering medications, tests, or procedures;   referring to other health care specialists if applicable;   documenting clinical information in the electronic health record;   independently interpreting results (not separately reported)   and communicating results to the patient.billed on mdm      (During COVID-19 public health emergency), evaluation of the following organ systems was limited: Vitals/Constitutional/EENT/Resp/CV/GI/GU/MS/Neuro/Skin/Heme-Lymph-Imm.  Pursuant to the emergency declaration under the [facility] Act and the , 1135 waiver authority and the [facility] and [employer] Act, this Virtual Visit was conducted with patient's (and/or legal  guardian's) consent, to reduce the patient's risk of exposure to COVID-19 and provide necessary medical care.  The patient (and/or legal guardian) has also been advised to contact this office for worsening conditions or problems, and seek emergency medical treatment and/or call 911 if deemed necessary.  Patient initiated the encounter and gave consent for the encounter.  Services were provided through a video synchronous discussion virtually to substitute for in-person clinic visit.   [employer], was evaluated through a synchronous (real-time) audio-video encounter. The patient (or guardian if applicable) is aware that this is a billable service, which includes applicable co-pays. This Virtual Visit was conducted with patient's (and/or legal guardian's) consent. The visit was conducted pursuant to the emergency declaration under the [employer] and the Irwin Brakeman, 1135 waiver authority and the [employer] and [employer] Act. Patient identification was verified, and a caregiver was present when appropriate. The patient was located in a state where the provider was licensed to provide care.

## 2020-09-05 MED ORDER — NAPROXEN 500 MG PO TABS
500 MG | ORAL_TABLET | ORAL | 1 refills | Status: DC
Start: 2020-09-05 — End: 2020-10-10

## 2020-09-05 NOTE — Telephone Encounter (Signed)
LOV 08/19/2020    No future appointments.

## 2020-10-09 NOTE — Telephone Encounter (Signed)
Last ov 08/19/2020   Future Appointments   Date Time Provider Department Center   11/05/2020  2:30 PM [facility] Alphia Moh

## 2020-10-09 NOTE — Telephone Encounter (Signed)
Last ov 08/19/2020   Future Appointments   Date Time Provider Department Center   11/05/2020  2:30 PM [facility]

## 2020-10-10 MED ORDER — ESCITALOPRAM OXALATE 5 MG PO TABS
5 MG | ORAL_TABLET | ORAL | 2 refills | Status: DC
Start: 2020-10-10 — End: 2021-02-04

## 2020-10-10 MED ORDER — NAPROXEN 500 MG PO TABS
500 MG | ORAL_TABLET | ORAL | 0 refills | Status: DC
Start: 2020-10-10 — End: 2020-12-01

## 2020-10-10 NOTE — Telephone Encounter (Signed)
I have sent in a refill.  However she has not been seen in the office since October 2021.  Please however get scheduled in October for a physical.

## 2020-11-05 ENCOUNTER — Ambulatory Visit: Payer: BLUE CROSS/BLUE SHIELD | Primary: Acute Care

## 2020-11-10 ENCOUNTER — Encounter

## 2020-11-10 ENCOUNTER — Inpatient Hospital Stay: Admit: 2020-11-10 | Payer: BLUE CROSS/BLUE SHIELD | Primary: Acute Care

## 2020-11-10 DIAGNOSIS — Z1231 Encounter for screening mammogram for malignant neoplasm of breast: Secondary | ICD-10-CM

## 2020-11-10 NOTE — Other (Signed)
Your mammogram is normal. We can repeat the screening in one year. Thank you for getting this important health screening accomplished.

## 2020-11-16 ENCOUNTER — Inpatient Hospital Stay
Admit: 2020-11-16 | Discharge: 2020-11-16 | Disposition: A | Discharge status: Home / Self Care | Payer: BLUE CROSS/BLUE SHIELD | Attending: Emergency Medicine

## 2020-11-16 DIAGNOSIS — R1013 Epigastric pain: Secondary | ICD-10-CM

## 2020-11-16 LAB — MICROSCOPIC URINALYSIS

## 2020-11-16 LAB — URINALYSIS WITH REFLEX TO CULTURE
Bilirubin Urine: NEGATIVE
Glucose, Ur: NEGATIVE mg/dL
Ketones, Urine: NEGATIVE mg/dL
Nitrite, Urine: NEGATIVE
Protein, UA: NEGATIVE mg/dL
Specific Gravity, UA: 1.02 (ref 1.005–1.030)
Urobilinogen, Urine: 0.2 E.U./dL (ref <2.0)
pH, UA: 7 (ref 5.0–8.0)

## 2020-11-16 LAB — CBC WITH AUTO DIFFERENTIAL
Basophils %: 0.6 %
Basophils Absolute: 0.1 10*3/uL (ref 0.0–0.2)
Eosinophils %: 1.8 %
Eosinophils Absolute: 0.1 10*3/uL (ref 0.0–0.6)
Hematocrit: 41.3 % (ref 36.0–48.0)
Hemoglobin: 14.2 g/dL (ref 12.0–16.0)
Lymphocytes %: 18 %
Lymphocytes Absolute: 1.4 10*3/uL (ref 1.0–5.1)
MCH: 32.5 pg (ref 26.0–34.0)
MCHC: 34.4 g/dL (ref 31.0–36.0)
MCV: 94.4 fL (ref 80.0–100.0)
MPV: 7.3 fL (ref 5.0–10.5)
Monocytes %: 5.5 %
Monocytes Absolute: 0.4 10*3/uL (ref 0.0–1.3)
Neutrophils %: 74.1 %
Neutrophils Absolute: 5.9 10*3/uL (ref 1.7–7.7)
Platelets: 313 10*3/uL (ref 135–450)
RBC: 4.38 M/uL (ref 4.00–5.20)
RDW: 12.6 % (ref 12.4–15.4)
WBC: 8 10*3/uL (ref 4.0–11.0)

## 2020-11-16 LAB — COMPREHENSIVE METABOLIC PANEL
ALT: 17 U/L (ref 10–40)
AST: 19 U/L (ref 15–37)
Albumin/Globulin Ratio: 1.7 (ref 1.1–2.2)
Albumin: 4.6 g/dL (ref 3.4–5.0)
Alkaline Phosphatase: 58 U/L (ref 40–129)
Anion Gap: 9 (ref 3–16)
BUN: 14 mg/dL (ref 7–20)
CO2: 28 mmol/L (ref 21–32)
Calcium: 9.4 mg/dL (ref 8.3–10.6)
Chloride: 100 mmol/L (ref 99–110)
Creatinine: 0.6 mg/dL (ref 0.6–1.1)
GFR African American: >60 (ref >60)
GFR Non-African American: >60 (ref >60)
Glucose: 117 mg/dL — ABNORMAL HIGH (ref 70–99)
Potassium: 3.8 mmol/L (ref 3.5–5.1)
Sodium: 137 mmol/L (ref 136–145)
Total Bilirubin: 0.4 mg/dL (ref 0.0–1.0)
Total Protein: 7.3 g/dL (ref 6.4–8.2)

## 2020-11-16 LAB — EKG 12-LEAD
Atrial Rate: 80 {beats}/min
P Axis: 67 degrees
P-R Interval: 120 ms
Q-T Interval: 372 ms
QRS Duration: 76 ms
QTc Calculation (Bazett): 429 ms
R Axis: 62 degrees
T Axis: -4 degrees
Ventricular Rate: 80 {beats}/min

## 2020-11-16 LAB — PREGNANCY, URINE: HCG(Urine) Pregnancy Test: NEGATIVE

## 2020-11-16 LAB — LIPASE: Lipase: 26 U/L (ref 13.0–60.0)

## 2020-11-16 MED ORDER — ALUM & MAG HYDROXIDE-SIMETH 200-200-20 MG/5ML PO SUSP
200-200-20 MG/5ML | Freq: Once | ORAL | Status: AC
Start: 2020-11-16 — End: 2020-11-16
  Administered 2020-11-16: 06:00:00 40 mL via ORAL

## 2020-11-16 MED ORDER — FAMOTIDINE 20 MG PO TABS
20 MG | ORAL_TABLET | Freq: Two times a day (BID) | ORAL | 3 refills | Status: DC
Start: 2020-11-16 — End: 2021-02-18

## 2020-11-16 MED ORDER — FAMOTIDINE (PF) 20 MG/2ML IV SOLN
20 MG/2ML | Freq: Once | INTRAVENOUS | Status: AC
Start: 2020-11-16 — End: 2020-11-16
  Administered 2020-11-16: 06:00:00 20 mg via INTRAVENOUS

## 2020-11-16 MED ORDER — ONDANSETRON HCL 4 MG/2ML IJ SOLN
4 MG/2ML | Freq: Once | INTRAMUSCULAR | Status: DC
Start: 2020-11-16 — End: 2020-11-16

## 2020-11-16 MED FILL — FAMOTIDINE (PF) 20 MG/2ML IV SOLN: 20 MG/2ML | INTRAVENOUS | Qty: 2

## 2020-11-16 MED FILL — MAG-AL PLUS 200-200-20 MG/5ML PO LIQD: 200-200-20 MG/5ML | ORAL | Qty: 60

## 2020-11-16 MED FILL — ONDANSETRON HCL 4 MG/2ML IJ SOLN: 4 MG/2ML | INTRAMUSCULAR | Qty: 4

## 2020-11-16 NOTE — ED Provider Notes (Signed)
[facility]  EMERGENCY [facility]PARTMENT [facility]NCOUNTER      Pt Name: Janice SmilingKathleen S Oliver  MRN: [id number]  Birthdate [birth year]  Date of evaluation: 11/16/2020  Provider: Leta JunglingZachary C Sheretha Shadd, MD    CHIEF COMPLAINT       Chief Complaint   Patient presents with    Abdominal Pain     Patient arrives via walk in with c/o Upper abdominal pain. Since Friday. States diarrhea x3 times in last 24 hours.          HISTORY OF PRESENT ILLNESS   (Location/Symptom, Timing/Onset,Context/Setting, Quality, Duration, Modifying Factors, Severity)  Note limiting factors.   Janice Oliver is a 40 y.o. female who presents to the ED with a chief complaint of abdominal pain for the past 2 days.  Onset gradual, progressively worsening, especially over the last couple of hours but she states that since her arrival to the emergency department, she actually feels like there is less pain.  Pain is localized to the epigastrium.  It does not radiate.  It is described as a dull ache.  Associated with mild nausea, no vomiting, multiple episodes of watery diarrhea, light brown, no dark or tarry stools, no hematochezia.  Patient takes naproxen chronically for migraine prevention.  Denies urinary symptoms.  No hematuria.  No vaginal bleeding or vaginal discharge.  Symptoms not otherwise alleviated or exacerbated by other factors.      NursingNotes were reviewed.    REVIEW OF SYSTEMS    (2-9 systems for level 4, 10 or more for level 5)     Review of Systems   Constitutional:  Negative for chills and fever.   HENT:  Negative for congestion and sore throat.    Eyes:  Negative for pain and visual disturbance.   Respiratory:  Negative for cough and shortness of breath.    Cardiovascular:  Negative for chest pain and palpitations.   Gastrointestinal:  Positive for abdominal pain, diarrhea and nausea. Negative for vomiting.   Genitourinary:  Negative for dysuria and frequency.   Musculoskeletal:  Negative for back pain and neck pain.   Skin:  Negative for  rash and wound.   Neurological:  Negative for dizziness, weakness and light-headedness.      PAST MEDICAL HISTORY     Past Medical History:   Diagnosis Date    Allergies     on allegra    Basal cell carcinoma     GAD (generalized anxiety disorder)     Migraines          SURGICALHISTORY       Past Surgical History:   Procedure Laterality Date    CESAREAN SECTION           CURRENT MEDICATIONS       Previous Medications    AZELASTINE (ASTELIN) 0.1 % NASAL SPRAY    1 spray by Nasal route 2 times daily Use in each nostril as directed    ESCITALOPRAM (LEXAPRO) 5 MG TABLET    TAKE 1 TABLET BY MOUTH EVERY DAY    NAPROXEN (NAPROSYN) 500 MG TABLET    TAKE 1 TABLET BY MOUTH DAILY AS NEEDED FOR MILD PAIN (1-3).    VALACYCLOVIR (VALTREX) 1 G TABLET    Take two tablets every 12 hours for 1 day.       ALLERGIES     Latex and Moxifloxacin    FAMILY HISTORY       Family History   Problem Relation Age of Onset  High Blood Pressure Mother     Diabetes Mother         Type 2    Hypertension Father     High Blood Pressure Father     Heart Disease Father     No Known Problems Sister     No Known Problems Sister     Other Brother         autoimmune disease    Hypertension Brother     Breast Cancer Maternal Grandmother 63    Alcohol Abuse Maternal Grandfather     Breast Cancer Paternal Grandmother 65    Alzheimer's Disease Paternal Grandmother     No Known Problems Paternal Grandfather           SOCIAL HISTORY       Social History     Socioeconomic History    Marital status: Married     Spouse name: None    Number of children: None    Years of education: None    Highest education level: None   Tobacco Use    Smoking status: Never    Smokeless tobacco: Never   Substance and Sexual Activity    Alcohol use: Yes     Comment: occasional    Drug use: Never     Social Determinants of Health     Financial Resource Strain: Low Risk     Difficulty of Paying Living Expenses: Not hard at all   Food Insecurity: No Food Insecurity    Worried About  Programme researcher, broadcasting/film/video in the Last Year: Never true    Ran Out of Food in the Last Year: Never true       SCREENINGS    Glasgow Coma Scale  Eye Opening: Spontaneous  Best Verbal Response: Oriented  Best Motor Response: Obeys commands  Glasgow Coma Scale Score: 15        PHYSICAL EXAM    (up to 7 for level 4, 8 or more for level 5)     ED Triage Vitals   BP Temp Temp Source Heart Rate Resp SpO2 Height Weight   11/16/20 0031 11/16/20 0049 11/16/20 0049 11/16/20 0031 11/16/20 0031 11/16/20 0031 11/16/20 0031 11/16/20 0031   (!) 140/91 99.6 ??F (37.6 ??C) Oral 85 18 100 % 5\' 2"  (1.575 m) 115 lb (52.2 kg)       General: Alert and oriented appropriately for age, No acute distress.  Eye: Normal conjunctiva. Sclera anicteric.  HENT: Oral mucosa is moist.  Respiratory: Respirations even and non-labored.  We are to auscultation bilaterally.  Cardiovascular: Normal rate, Regular rhythm.  Intact peripheral pulses.  No edema.  No JVD.  Gastrointestinal: Soft, Non-tender, Non-distended.  No rebound, no guarding.  No CVAT.  GU: deferred.  Musculoskeletal: No swelling.  Integumentary: Warm, Dry.  Neurologic: Alert and appropriate for age. No focal deficits.  Psychiatric: Cooperative.    DIAGNOSTIC RESULTS     LABS:  Labs Reviewed   COMPREHENSIVE METABOLIC PANEL - Abnormal; Notable for the following components:       Result Value    Glucose 117 (*)     All other components within normal limits   URINALYSIS WITH REFLEX TO CULTURE - Abnormal; Notable for the following components:    Blood, Urine LARGE (*)     Leukocyte Esterase, Urine SMALL (*)     All other components within normal limits   MICROSCOPIC URINALYSIS - Abnormal; Notable for the following components:    WBC, UA 10-20 (*)  RBC, UA 5-10 (*)     Epithelial Cells, UA 11-20 (*)     All other components within normal limits   CULTURE, URINE   CBC WITH AUTO DIFFERENTIAL   LIPASE   PREGNANCY, URINE       All other labs were within normal range or not returned as of this  dictation.      EMERGENCY DEPARTMENT COURSE and DIFFERENTIAL DIAGNOSIS/MDM:   Vitals:    Vitals:    11/16/20 0031 11/16/20 0049 11/16/20 0154   BP: (!) 140/91 133/88 131/82   Pulse: 85 68 64   Resp: 18 16 16    Temp:  99.6 ??F (37.6 ??C)    TempSrc:  Oral    SpO2: 100% 100% 99%   Weight: 115 lb (52.2 kg) 115 lb (52.2 kg)    Height: 5\' 2"  (1.575 m) 5\' 2"  (1.575 m)          Medical decision making:  40 year old female who presents with epigastric pain that does not radiate, associated with mild nausea and diarrhea for the past few days.  Patient initially thought it was food poisoning, but given the persistence, she is worried it could be potentially something else.  She has no significant tenderness on exam, is hemodynamically stable and afebrile.  Doubt pancreatitis or biliary colic given the absence of tenderness.  Labs to risk stratify for other acute process.  Labs largely unremarkable, CBC, CMP, lipase within normal limits.  Urine shows bacteriuria however patient denies urinary symptoms again on reassessment.  No flank pain to suggest stone.  She is feeling better after Pepcid and Maalox given in the department, declined Zofran as she is stating she is no longer nauseous.  Suspect gastritis, gastroenteritis given the presence of diarrhea as well.  Likely viral though NSAID use has potential to create gastritis as well.  Encouraged decreased NSAID use, follow-up with PCP for evaluation of potential H. pylori, GI referral given for potential EGD.  Patient is amenable and agreeable to this plan, she is hemodynamically stable and tolerating p.o. on reassessment.  Stable for and amenable to discharge home.    Medications   ondansetron (ZOFRAN) injection 8 mg (8 mg IntraVENous Not Given 11/16/20 0209)   famotidine (PEPCID) injection 20 mg (20 mg IntraVENous Given 11/16/20 0206)   aluminum & magnesium hydroxide-simethicone (MAALOX) 200-200-20 MG/5ML suspension 40 mL (40 mLs Oral Given 11/16/20 0207)     I estimate there is  LOW risk for (including but not limited to) ACUTE APPENDICITIS, BOWEL OBSTRUCTION, ACUTE CHOLECYSTITIS, RUPTURED DIVERTICULITIS, INCARCERATED or STRANGULATED HERNIA, HEMMORHAGIC PANCREATITIS, or PERFORATED BOWEL/ULCER, thus I consider the discharge disposition reasonable. Janice Oliver (or their surrogate) and I have discussed the diagnosis and risks, and we agree with discharging home with close follow-up. We also discussed returning to the Emergency Department immediately if new or worsening symptoms occur. We have discussed the symptoms which are most concerning that necessitate immediate return.        FINAL IMPRESSION      1. Abdominal pain, epigastric    2. Dyspepsia          DISPOSITION/PLAN   DISPOSITION Decision To Discharge 11/16/2020 02:22:54 AM      PATIENT REFERRED TO:  [facility]  ED  [address]  [place] [facility] [address]  [phone]    If symptoms worsen    [place], APRN - CNP  [address]  [place] Doran Clay [address]  [phone]    In  1 day      [place] [facility]  [address]  [address]  [place] [address]  [phone]    In 1 day        DISCHARGE MEDICATIONS:  New Prescriptions    FAMOTIDINE (PEPCID) 20 MG TABLET    Take 1 tablet by mouth in the morning and 1 tablet before bedtime.          (Please note that portions of this note were completed with a voice recognition program.Efforts were made to edit the dictations but occasionally words are mis-transcribed.)    Leta Jungling, MD (electronically signed)  Attending Emergency Physician          Leta Jungling, MD  11/16/20 [phone]

## 2020-11-17 LAB — CULTURE, URINE: Urine Culture, Routine: <10000

## 2020-11-28 ENCOUNTER — Encounter

## 2020-12-01 MED ORDER — NAPROXEN 500 MG PO TABS
500 MG | ORAL_TABLET | ORAL | 1 refills | Status: DC
Start: 2020-12-01 — End: 2021-06-15

## 2020-12-01 NOTE — Telephone Encounter (Signed)
Future Appointments   Date Time Provider Department Center   01/26/2021  4:00 PM Doran Clay, APRN - CNP [facility] [facility]     06/19/2020

## 2021-01-04 ENCOUNTER — Encounter

## 2021-01-05 MED ORDER — VALACYCLOVIR HCL 1 G PO TABS
1 g | ORAL_TABLET | ORAL | 1 refills | Status: DC
Start: 2021-01-05 — End: 2021-02-18

## 2021-01-05 NOTE — Telephone Encounter (Signed)
Future Appointments   Date Time Provider Department Center   01/26/2021  4:00 PM Doran Clay, APRN - CNP [facility] [place] [facility]     LOV 08/19/2020

## 2021-01-26 ENCOUNTER — Encounter: Payer: BLUE CROSS/BLUE SHIELD | Attending: Acute Care | Primary: Acute Care

## 2021-01-27 ENCOUNTER — Inpatient Hospital Stay: Payer: BLUE CROSS/BLUE SHIELD | Primary: Acute Care

## 2021-01-27 ENCOUNTER — Encounter

## 2021-01-27 ENCOUNTER — Inpatient Hospital Stay: Admit: 2021-01-27 | Payer: BLUE CROSS/BLUE SHIELD | Primary: Acute Care

## 2021-01-27 DIAGNOSIS — N6315 Unspecified lump in the right breast, overlapping quadrants: Secondary | ICD-10-CM

## 2021-02-03 NOTE — Telephone Encounter (Signed)
06/19/2020    Future Appointments   Date Time Provider Department Center   02/18/2021  2:40 PM Doran Clay, APRN - CNP [facility] [place] [facility]

## 2021-02-04 ENCOUNTER — Telehealth: Admit: 2021-02-05 | Discharge: 2021-02-05 | Payer: BLUE CROSS/BLUE SHIELD | Attending: Acute Care | Primary: Acute Care

## 2021-02-04 DIAGNOSIS — J019 Acute sinusitis, unspecified: Secondary | ICD-10-CM

## 2021-02-04 MED ORDER — ESCITALOPRAM OXALATE 5 MG PO TABS
5 MG | ORAL_TABLET | ORAL | 0 refills | Status: DC
Start: 2021-02-04 — End: 2021-02-18

## 2021-02-04 NOTE — Telephone Encounter (Signed)
From: Janice Oliver  To: Doran Clay  Sent: 02/03/2021 5:51 PM EDT  Subject: Covid Recovery    Hello! I had Covid 2 weeks ago and am still very congested with a croupy throat, coughing, and snot. I was wondering if I could be prescribed something to help speed up the congestion as I am concerned about it settling in my lungs and/or causing some type of infection. I've been taking extra strength Mucinex with an expectorant, nasal decongestant, and cough suppresant, but it is not working. And I do not want another cough medicine, as that has never really helped with my congestion in the past. Possibly a Z Pack or something else along those lines? Thank you! [name] (PS, I have an in office visit coming up in 2 weeks, so would like to avoid a virtual visit if possible, but understand if that needs to happen. OR I could come in for the office visit earlier if there is availability.)

## 2021-02-04 NOTE — Telephone Encounter (Signed)
Please call patient to schedule an appointment. She is due for annual physical

## 2021-02-05 ENCOUNTER — Encounter: Payer: BLUE CROSS/BLUE SHIELD | Attending: Acute Care | Primary: Acute Care

## 2021-02-05 MED ORDER — BENZONATATE 200 MG PO CAPS
200 MG | ORAL_CAPSULE | Freq: Three times a day (TID) | ORAL | 0 refills | Status: DC | PRN
Start: 2021-02-05 — End: 2021-02-18

## 2021-02-05 MED ORDER — SULFAMETHOXAZOLE-TRIMETHOPRIM 800-160 MG PO TABS
800-160 MG | ORAL_TABLET | Freq: Two times a day (BID) | ORAL | 0 refills | Status: AC
Start: 2021-02-05 — End: 2021-02-12

## 2021-02-05 NOTE — Progress Notes (Signed)
I have reviewed the questionnaire.  It does seem that her symptoms have significantly improved with the addition of Lexapro.   Diagnosis Orders   1. Generalized anxiety disorder           Continue lexapro, refill sent in medication refill encounter  Follow up in 6 month or sooner if needed    Approximately 10 minutes was spent on this and the medication refill encounter

## 2021-02-05 NOTE — Progress Notes (Signed)
Reviewed the provided questionnaire.  Patient had COVID 2 weeks ago and now has continued cough congestion and mucus drainage.  She also is experiencing pharyngitis.       Diagnosis Orders   1. Acute bacterial sinusitis  sulfamethoxazole-trimethoprim (BACTRIM DS;SEPTRA DS) 800-160 MG per tablet    benzonatate (TESSALON) 200 MG capsule        Orders Placed This Encounter   Medications    sulfamethoxazole-trimethoprim (BACTRIM DS;SEPTRA DS) 800-160 MG per tablet     Sig: Take 1 tablet by mouth 2 times daily for 7 days     Dispense:  14 tablet     Refill:  0    benzonatate (TESSALON) 200 MG capsule     Sig: Take 1 capsule by mouth 3 times daily as needed for Cough     Dispense:  30 capsule     Refill:  0      Continue supportive care  Complete antibiotics  Push fluids  Report if fails to improve  Approximately 10 minutes spent on this encounter

## 2021-02-18 ENCOUNTER — Ambulatory Visit: Admit: 2021-02-18 | Discharge: 2021-02-18 | Payer: BLUE CROSS/BLUE SHIELD | Attending: Acute Care | Primary: Acute Care

## 2021-02-18 DIAGNOSIS — Z0000 Encounter for general adult medical examination without abnormal findings: Secondary | ICD-10-CM

## 2021-02-18 MED ORDER — ESCITALOPRAM OXALATE 5 MG PO TABS
5 MG | ORAL_TABLET | ORAL | 3 refills | Status: AC
Start: 2021-02-18 — End: 2022-02-10

## 2021-02-18 MED ORDER — VALACYCLOVIR HCL 1 G PO TABS
1 g | ORAL_TABLET | ORAL | 1 refills | Status: DC
Start: 2021-02-18 — End: 2021-12-16

## 2021-02-18 NOTE — Progress Notes (Signed)
Janice Oliver  Date of Birth:  16-Nov-1980    Janice Oliver    Date of Service:  02/18/2021    Chief Complaint:   Janice Oliver is a 40 y.o. female who presents for complete physical examination.    HPI: Janice Oliver is here for he physical. She continues to note some cough s/p covid but her allergies are also acting up    Wt Readings from Last 3 Encounters:   02/18/21 114 lb (51.7 kg)   11/16/20 115 lb (52.2 kg)   03/17/20 116 lb (52.6 kg)       BP Readings from Last 3 Encounters:   02/18/21 122/82   11/16/20 (!) 142/91   01/25/20 122/68       Vitals:    02/18/21 1435   BP: 122/82   Site: Left Upper Arm   Position: Sitting   Pulse: 52   Resp: 16   SpO2: 98%   Weight: 114 lb (51.7 kg)       Patient Active Problem List   Diagnosis    Basal cell carcinoma of skin    Generalized anxiety disorder    Chronic midline low back pain without sciatica    Migraine    Seasonal allergic rhinitis    Uterine scar from previous cesarean delivery, antepartum       Preventive Care:  Health Maintenance   Topic Date Due    Varicella vaccine (1 of 2 - 2-dose childhood series) Never done    HIV screen  Never done    Hepatitis C screen  Never done    DTaP/Tdap/Td vaccine (1 - Tdap) Never done    Cervical cancer screen  Never done    COVID-19 Vaccine (4 - Booster) 04/05/2020    Flu vaccine (1) 11/17/2020    Depression Screen  02/04/2022    Lipids  01/03/2025    Hepatitis A vaccine  Aged Out    Hib vaccine  Aged Out    Meningococcal (ACWY) vaccine  Aged Out    Pneumococcal 0-64 years Vaccine  Aged Out      Last eye exam: 2022, normal  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  Seatbelt use: yes  Lipid panel:   Lab Results   Component Value Date    CHOL 165 01/04/2020    TRIG 31 01/04/2020    HDL 71 (H) 01/04/2020    LDLCALC 88 01/04/2020      Screenings due: immunizations,        Immunization History   Administered Date(s) Administered    COVID-19, MODERNA BLUE border, Primary or Immunocompromised,  (age 12y+), IM, 100 mcg/0.20mL 06/14/2019    COVID-19, PFIZER PURPLE top, DILUTE for use, (age 9 y+), 44mcg/0.3mL 06/14/2019, 07/05/2019, 02/09/2020    Influenza, FLUARIX, FLULAVAL, FLUZONE (age 56 mo+) AND AFLURIA, (age 49 y+), PF, 0.48mL 02/09/2020       Allergies   Allergen Reactions    Moxifloxacin Shortness Of Breath       Current Outpatient Medications   Medication Sig Dispense Refill    escitalopram (LEXAPRO) 5 MG tablet TAKE 1 TABLET BY MOUTH EVERY DAY 30 tablet 0    naproxen (NAPROSYN) 500 MG tablet TAKE 1 TABLET BY MOUTH DAILY AS NEEDED FOR MILD PAIN (1-3). 30 tablet 1    azelastine (ASTELIN) 0.1 % nasal spray 1 spray by Nasal route 2 times daily Use in each nostril as directed 60 mL 1  No current facility-administered medications for this visit.       Past Medical History:   Diagnosis Date    Allergies     on allegra    Basal cell carcinoma     GAD (generalized anxiety disorder)     Migraines        Past Surgical History:   Procedure Laterality Date    CESAREAN SECTION         Family History   Problem Relation Age of Onset    High Blood Pressure Mother     Diabetes Mother         Type 2    Hypertension Father     High Blood Pressure Father     Heart Disease Father     No Known Problems Sister     No Known Problems Sister     Other Brother         autoimmune disease    Hypertension Brother     Breast Cancer Maternal Grandmother 56    Alcohol Abuse Maternal Grandfather     Breast Cancer Paternal Grandmother 18    Alzheimer's Disease Paternal Grandmother     No Known Problems Paternal Grandfather        Social History     Socioeconomic History    Marital status: Married     Spouse name: Not on file    Number of children: Not on file    Years of education: Not on file    Highest education level: Not on file   Occupational History    Not on file   Tobacco Use    Smoking status: Never    Smokeless tobacco: Never   Substance and Sexual Activity    Alcohol use: Yes     Comment: occasional    Drug use: Never     Sexual activity: Not on file   Other Topics Concern    Not on file   Social History Narrative    Not on file     Social Determinants of Health     Financial Resource Strain: Not on file   Food Insecurity: Not on file   Transportation Needs: Not on file   Physical Activity: Not on file   Stress: Not on file   Social Connections: Not on file   Intimate Partner Violence: Not on file   Housing Stability: Not on file       Review of Systems:  Review of Systems   Constitutional:  Negative for activity change, chills, fatigue, fever and unexpected weight change.   HENT:  Positive for congestion and postnasal drip. Negative for ear discharge, mouth sores, sinus pressure, sore throat and trouble swallowing.    Eyes:  Negative for redness, itching and visual disturbance.   Respiratory:  Negative for cough, chest tightness, shortness of breath and wheezing.    Cardiovascular:  Negative for chest pain, palpitations and leg swelling.   Gastrointestinal:  Negative for abdominal pain, constipation, diarrhea and nausea.   Endocrine: Negative for cold intolerance, heat intolerance, polydipsia, polyphagia and polyuria.   Genitourinary:  Negative for dysuria, frequency and urgency.        Pap utd- [facility]     Musculoskeletal:  Positive for arthralgias (right hip). Negative for joint swelling and myalgias.   Skin:  Negative for color change, pallor and rash.   Allergic/Immunologic: Positive for environmental allergies. Negative for food allergies and immunocompromised state.   Neurological:  Negative for dizziness, syncope, weakness and  headaches.   Hematological:  Does not bruise/bleed easily.   Psychiatric/Behavioral:  Negative for behavioral problems, hallucinations and sleep disturbance. The patient is not nervous/anxious.         Mood well controlled with lexapro     Physical Exam:     Body mass index is 20.85 kg/m??.     Physical Exam  Vitals and nursing note reviewed.   Constitutional:       General: She is not in acute  distress.     Appearance: Normal appearance. She is well-developed. She is not diaphoretic.   HENT:      Head: Normocephalic and atraumatic.      Right Ear: External ear normal.      Left Ear: External ear normal.      Nose: Nose normal. No rhinorrhea.      Mouth/Throat:      Pharynx: Oropharynx is clear. No oropharyngeal exudate.   Eyes:      General:         Right eye: No discharge.         Left eye: No discharge.      Conjunctiva/sclera: Conjunctivae normal.   Neck:      Trachea: Phonation normal.   Cardiovascular:      Rate and Rhythm: Normal rate and regular rhythm.      Heart sounds: Normal heart sounds. No murmur heard.    No friction rub. No gallop.   Pulmonary:      Effort: Pulmonary effort is normal. No respiratory distress.      Breath sounds: Normal breath sounds. No wheezing or rales.   Abdominal:      General: Bowel sounds are normal. There is no distension.      Palpations: Abdomen is soft.      Tenderness: There is no abdominal tenderness.   Musculoskeletal:         General: No tenderness. Normal range of motion.      Cervical back: Normal range of motion and neck supple.   Lymphadenopathy:      Cervical: No cervical adenopathy.   Skin:     General: Skin is warm and dry.      Coloration: Skin is not jaundiced or pale.      Findings: No erythema or rash.   Neurological:      General: No focal deficit present.      Mental Status: She is alert and oriented to person, place, and time.      Motor: No abnormal muscle tone.      Coordination: Coordination normal.   Psychiatric:         Mood and Affect: Mood normal.         Behavior: Behavior normal.         Thought Content: Thought content normal.         Judgment: Judgment normal.       Assessment      ICD-10-CM    1. Annual physical exam  Z00.00 LIPID PANEL      2. H/O cold sores  Z86.19 valACYclovir (VALTREX) 1 g tablet          Plan  Labs today  Generally healthy adult  Discussed immunizations      Orders Placed This Encounter   Medications     escitalopram (LEXAPRO) 5 MG tablet     Sig: TAKE 1 TABLET BY MOUTH EVERY DAY     Dispense:  90 tablet     Refill:  3  valACYclovir (VALTREX) 1 g tablet     Sig: TAKE TWO TABLETS EVERY 12 HOURS FOR 1 DAY.     Dispense:  12 tablet     Refill:  1          Orders Placed This Encounter   Procedures    Influenza, FLUCELVAX, (age 42 mo+), IM, PF, 0.5 mL    LIPID PANEL     Standing Status:   Future     Standing Expiration Date:   02/18/2022       Patient Education:    Counseled on importance of healthy diet and regular exercise of at least 30 minutes on four or more days during the week.  Counseled on skin safety, SPF 30 or higher prior to going outdoors and reapplication every twohours while outside. Monitor moles for changes, report to provider if greater than 6 mm, color variations, asymmetry, redness, scales, and/or overlying skin changes  Counseled on safety, wear seatbelt, do not consumealcohol and drive or drive with anyone who has consumed alcohol  Counseled on importance of monthly self breast exams, perform on same time each month, monitor for newlumps/bumps/masses, tenderness, changes to size or contour, dimpling, nipple discharge, new nipple retraction, and overlying skin changes, report to provider    Follow Up    6 months for mood

## 2021-03-03 NOTE — Telephone Encounter (Signed)
LOV 02/18/2021   Future Appointments   Date Time Provider Department Center   02/18/2022  3:00 PM Doran Clay, APRN - CNP [place] [facility]

## 2021-06-09 NOTE — Telephone Encounter (Signed)
From: Roslyn Smiling  To: Doran Clay  Sent: 06/09/2021 9:32 AM EST  Subject: Perianal Dermatitus    Hi! 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 I was given an antibiotic from my OBGYN office, which did clear some things up. I stopped this medication this past Thursday 2/16. Over the weekend, I've noticed itching has returned around my anus and I have a particularly itchy/sore spot on my butt crack (which was there before and has resumed). I'm guessing I need another round of antibiotics? I was also given 2 types of cream to use (one is hydrocortisone, 2/5%, which gives SOME relief, but not much) and the other is for fungus (what my first mis-diagnosis was). I just don't want this darn thing to spread again! I've been miserable since 04/12/21 ;( Let me know if I need to call in or make an in-office visit! I'm at work now, so just thought I'd type a message ;) Thanks!

## 2021-06-15 MED ORDER — NAPROXEN 500 MG PO TABS
500 MG | ORAL_TABLET | ORAL | 1 refills | Status: DC
Start: 2021-06-15 — End: 2022-02-10

## 2021-06-15 NOTE — Telephone Encounter (Signed)
Last ov 02/18/2021   Future Appointments   Date Time Provider Department Center   02/18/2022  3:00 PM Doran Clay, APRN - CNP [facility]

## 2021-10-12 ENCOUNTER — Telehealth: Admit: 2021-10-12 | Discharge: 2021-10-12 | Payer: BLUE CROSS/BLUE SHIELD | Attending: Family | Primary: Acute Care

## 2021-10-12 DIAGNOSIS — H1030 Unspecified acute conjunctivitis, unspecified eye: Secondary | ICD-10-CM

## 2021-10-12 MED ORDER — TOBRAMYCIN 0.3 % OP SOLN
0.3 % | Freq: Four times a day (QID) | OPHTHALMIC | 0 refills | Status: AC
Start: 2021-10-12 — End: 2021-10-19

## 2021-10-12 NOTE — Progress Notes (Signed)
Janice Oliver (DOB:  1980/05/07) is a Established patient, presenting virtually for evaluation of the following:  Right eye red, itching, watery discharge with crusty discharge in the a.m., feels gritty since last week    Assessment & Plan   Below is the assessment and plan developed based on review of pertinent history, physical exam, labs, studies, and medications.  1. Acute bacterial conjunctivitis, unspecified laterality  Problem  -     tobramycin (TOBREX) 0.3 % ophthalmic solution; Place 1 drop into the right eye in the morning and 1 drop at noon and 1 drop in the evening and 1 drop before bedtime. Do all this for 7 days., Disp-5 mL, R-0Normal  See patient instructions    She was instructed that if this does not improve her symptoms or if they worsen she will need to contact her eye doctor as this is all we can do.  She verbalized understanding of this information    Call your doctor now or seek immediate medical care if:    You have pain in your eye, not just irritation on the surface.     You have a change in vision or loss of vision.     You have an increase in discharge from the eye.     Your eye has not started to improve or begins to get worse within 48 hours after you start using antibiotics.     Pinkeye lasts longer than 7 days.       She can also follow-up in office with her PCP for evaluation because    The patient would benefit from future follow up with their usual PCP. As of the end of their Virtualist Visit today, follow up visit status is as follows: No PCP availability           Subjective   This is a 41 year old female patient of Janice Bible, APRN consenting to a virtual visit  She has complaints of right eye redness, itchiness, watery discharge with crusted discharge noted in the a.m.  She states it feels gritty also.  Her symptoms started a week ago and she had some left over Polytrim B eyedrops from a previous infection that she started using last Wednesday.  She thought it was  getting better but it keeps coming back.  She states that over this time she has used disposable contacts and she was told to not use contacts until after the infection gets better.  She verbalized understanding of this    Review of Systems   Eyes:  Positive for discharge, redness and itching. Negative for photophobia and visual disturbance.   All other systems reviewed and are negative.      Objective   Patient-Reported Vitals  No data recorded     Physical Exam  [INSTRUCTIONS:  "[x] " Indicates a positive item  "[] " Indicates a negative item  -- DELETE ALL ITEMS NOT EXAMINED]    Constitutional: [x]  Appears well-developed and well-nourished [x]  No apparent distress      []  Abnormal -     Mental status: [x]  Alert and awake  [x]  Oriented to person/place/time [x]  Able to follow commands    []  Abnormal -     Eyes:   EOM    [x]   Normal    []  Abnormal -   Sclera  []   Normal    [x]  Abnormal -red          Discharge []   None visible   [x]  Abnormal - clear  Noted crusty drainage on eye lids    HENT: [x]  Normocephalic, atraumatic  []  Abnormal -   []  Mouth/Throat: Mucous membranes are moist    External Ears []  Normal  []  Abnormal -    Neck: [x]  No visualized mass []  Abnormal -     Pulmonary/Chest: [x]  Respiratory effort normal   [x]  No visualized signs of difficulty breathing or respiratory distress        []  Abnormal -      Musculoskeletal:   []  Normal gait with no signs of ataxia         [x]  Normal range of motion of neck        []  Abnormal -     Neurological:        [x]  No Facial Asymmetry (Cranial nerve 7 motor function) (limited exam due to video visit)          []  No gaze palsy        []  Abnormal -          Skin:        [x]  No significant exanthematous lesions or discoloration noted on facial skin         []  Abnormal -            Psychiatric:       [x]  Normal Affect []  Abnormal -           On this date 10/12/2021 I have spent 20 minutes reviewing previous notes, test results and face to face (virtual) with the patient  discussing the diagnosis and importance of compliance with the treatment plan as well as documenting on the day of the visit.    Janice Oliver, was evaluated through a synchronous (real-time) audio-video encounter. The patient (or guardian if applicable) is aware that this is a billable service, which includes applicable co-pays. This Virtual Visit was conducted with patient's (and/or legal guardian's) consent. Patient identification was verified, and a caregiver was present when appropriate.   The patient was located at this    Provider was located at Home (Appt Dept State): [place]         -- Janice Fahrner, APRN - CNP

## 2021-11-06 ENCOUNTER — Ambulatory Visit
Admit: 2021-11-06 | Discharge: 2021-11-06 | Payer: BLUE CROSS/BLUE SHIELD | Attending: Otolaryngology | Primary: Acute Care

## 2021-11-06 DIAGNOSIS — H6122 Impacted cerumen, left ear: Secondary | ICD-10-CM

## 2021-11-06 MED ORDER — BETAMETHASONE SOD PHOS & ACET 6 (3-3) MG/ML IJ SUSP
6 (3-3) MG/ML | Freq: Once | INTRAMUSCULAR | Status: AC
Start: 2021-11-06 — End: 2021-11-06
  Administered 2021-11-06: 16:00:00 6 mg via INTRAMUSCULAR

## 2021-11-06 NOTE — Progress Notes (Signed)
 CHIEF COMPLAINT: Dizziness    HISTORY OF PRESENT ILLNESS:  41 y.o. female who presents with dizziness of 2 months duration.  When she rols over she gets dizzy. No true vertigo.  No nausea.  Gets dizzy for a few seconds when she stands up quickly. No hearing loss.  No tinnitus.  No ear fullness.  In addition, the patient has seasonal allergic symptoms of rhinorrhea, epiphora, pruritus, sneezing.  She has taken over-the-counter antihistamines but they have been unhelpful.  PAST MEDICAL HISTORY:   Social History     Tobacco Use   Smoking Status Never   Smokeless Tobacco Never                                                    Social History     Substance and Sexual Activity   Alcohol Use Yes    Comment: occasional                                                    Current Outpatient Medications:     Fexofenadine HCl (ALLEGRA ALLERGY PO), Take by mouth, Disp: , Rfl:     naproxen (NAPROSYN) 500 MG tablet, TAKE 1 TABLET BY MOUTH DAILY AS NEEDED FOR MILD PAIN (1-3)., Disp: 30 tablet, Rfl: 1    escitalopram (LEXAPRO) 5 MG tablet, TAKE 1 TABLET BY MOUTH EVERY DAY, Disp: 90 tablet, Rfl: 3    valACYclovir (VALTREX) 1 g tablet, TAKE TWO TABLETS EVERY 12 HOURS FOR 1 DAY., Disp: 12 tablet, Rfl: 1    azelastine (ASTELIN) 0.1 % nasal spray, 1 spray by Nasal route 2 times daily Use in each nostril as directed, Disp: 60 mL, Rfl: 1                                                 Past Medical History:   Diagnosis Date    Allergic rhinitis     Allergies     on allegra    Basal cell carcinoma     Dizziness     GAD (generalized anxiety disorder)     Migraines     TMJ dysfunction                                                     Past Surgical History:   Procedure Laterality Date    CESAREAN SECTION       FAMILY HISTORY: Family history reviewed.  Except as noted in history of present illness, there is no pertinent family history      REVIEW OF SYSTEMS:  All pertinent positive and negative review of systems included in HPI.  Otherwise, all  systems are reviewed and negative.    PHYSICAL EXAMINATION:   GENERAL: wdwn- no acute distress  RESPIRATORY:  No stridor or respiratory distress  COMMUNICATION :  Normal voice  MENTAL STATUS:  Mood and affect normal, oriented X  3  HEAD AND FACE:  No abnormalities of the skin of face or head  EXTERNAL EARS AND NOSE:  Normal pinnae bilateral  FACIAL MUSCLES:  All branches of facial nerve intact  EXTRAOCULAR MUSCLES: Intact with full range of motion  FACE PALPATION:  No tenderness over sinuses.  Zygomatic arches and orbital rims intact  OTOSCOPY: Right ear normal.  Cerumen occludes the left external auditory canal.  Cerumen removed from the left external auditory canal with curettes.  The tympanic membrane and middle ear space are normal.  TUNING FORKS: Rinne ++ Weber midline at 512 Hz  INTRANASAL:  Septum midline, turbinates normal, meati clear.  LIPS, TEETH, GINGIVA:  Normal mucosa  PHARYNX:  Normal  NECK:  No masses.  LYMPHATIC:  No cervical adenopathy  SALIVARY GLANDS:  No swelling or masses in the parotid or submandibular salivary glands  THYROID:  No goiter or thyroid masses.  POSITIONAL TESTING: Patient described dizziness when lying down and turning to both sides.  There was no latency or fatigability.  There is no associated nystagmus.  IMPRESSION: Dizziness.  She is symptomatic when she is put on the [employer] position but there is no nystagmus.  Allergic rhinitis.  PLAN: For her allergic disease have given her betamethasone 6 mg intramuscularly.  I have provided her with some exercises for canalith repositioning.  FOLLOW-UP: To report the effectiveness of the the canalith repositioning and the corticosteroid injection.

## 2021-11-10 ENCOUNTER — Encounter

## 2021-11-10 ENCOUNTER — Inpatient Hospital Stay: Admit: 2021-11-10 | Payer: BLUE CROSS/BLUE SHIELD | Primary: Acute Care

## 2021-11-10 DIAGNOSIS — Z1231 Encounter for screening mammogram for malignant neoplasm of breast: Secondary | ICD-10-CM

## 2021-11-12 ENCOUNTER — Encounter

## 2021-11-26 ENCOUNTER — Inpatient Hospital Stay: Admit: 2021-11-26 | Payer: BLUE CROSS/BLUE SHIELD | Primary: Acute Care

## 2021-11-26 ENCOUNTER — Encounter

## 2021-11-26 DIAGNOSIS — R928 Other abnormal and inconclusive findings on diagnostic imaging of breast: Secondary | ICD-10-CM

## 2021-12-16 MED ORDER — VALACYCLOVIR HCL 1 G PO TABS
1 g | ORAL_TABLET | ORAL | 2 refills | Status: AC
Start: 2021-12-16 — End: 2022-11-29

## 2021-12-16 NOTE — Addendum Note (Signed)
Addended by: Doran Clay on: 12/16/2021 05:01 PM     Modules accepted: Orders

## 2021-12-16 NOTE — Telephone Encounter (Signed)
Patient needs a refill on valACYclovir (VALTREX) 1 g tablet           Pharmacy: [id number] - [place], [place] - [address] Demetrius Charity [phone] - F [phone]

## 2022-02-05 NOTE — Telephone Encounter (Signed)
Spoke to pt, appt scld    Future Appointments   Date Time Provider [place]   02/17/2022  3:40 PM Maricela Curet, APRN - CNP [facility]   05/31/2022  2:00 PM [facility] [place] [facility]

## 2022-02-05 NOTE — Telephone Encounter (Signed)
-----   Message from [place] sent at 02/05/2022  1:27 PM EDT -----  Subject: Appointment Request    Reason for Call: Established Patient Appointment needed: Routine Physical   Exam    QUESTIONS    Reason for appointment request? Available appointments did not meet   patient need     Additional Information for Provider? pt needs to re-schedule for AWV and   possibly change anxiety meds. cannot find any avail. appointments needs in   by end of this month. please call with availability.  ---------------------------------------------------------------------------  --------------  Rod Can INFO  [phone]; OK to leave message on voicemail  ---------------------------------------------------------------------------  --------------  SCRIPT ANSWERS

## 2022-02-10 ENCOUNTER — Encounter

## 2022-02-10 ENCOUNTER — Encounter: Admit: 2022-02-10 | Discharge: 2022-02-10 | Payer: BLUE CROSS/BLUE SHIELD | Attending: Acute Care | Primary: Acute Care

## 2022-02-10 DIAGNOSIS — F419 Anxiety disorder, unspecified: Secondary | ICD-10-CM

## 2022-02-10 LAB — LIPID PANEL
Cholesterol, Total: 160 mg/dL (ref 0–199)
HDL: 72 mg/dL — ABNORMAL HIGH (ref 40–60)
LDL Calculated: 79 mg/dL (ref <100)
Triglycerides: 43 mg/dL (ref 0–150)
VLDL Cholesterol Calculated: 9 mg/dL

## 2022-02-10 MED ORDER — ESCITALOPRAM OXALATE 10 MG PO TABS
10 MG | ORAL_TABLET | ORAL | 1 refills | Status: AC
Start: 2022-02-10 — End: 2022-06-11

## 2022-02-10 NOTE — Progress Notes (Signed)
02/10/2022    This is a 41 y.o. female   Chief Complaint   Patient presents with    Anxiety     Possibly increase medication    .    Janice Oliver is seen today for follow up on anxiety. She increased the lexapro to 10 mg about 3 weeks ago. 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 She felt more and more anxiety that was not controlled with her coping strategies of breathing techniques and mindfulness. She was also noting physical symptoms which have resolved with increasing her lexapro. She wishes to continue at the higher dose         Patient Active Problem List   Diagnosis    Basal cell carcinoma of skin    Generalized anxiety disorder    Migraine    Seasonal allergic rhinitis    Uterine scar from previous cesarean delivery, antepartum    Acquired hammer toe of left foot    Acquired hammer toe of right foot       Current Outpatient Medications   Medication Sig Dispense Refill    valACYclovir (VALTREX) 1 g tablet Take two tablets every 12 hours for 1 day. 4 tablet 2    Fexofenadine HCl (ALLEGRA ALLERGY PO) Take by mouth      naproxen (NAPROSYN) 500 MG tablet TAKE 1 TABLET BY MOUTH DAILY AS NEEDED FOR MILD PAIN (1-3). 30 tablet 1    escitalopram (LEXAPRO) 5 MG tablet TAKE 1 TABLET BY MOUTH EVERY DAY 90 tablet 3    azelastine (ASTELIN) 0.1 % nasal spray 1 spray by Nasal route 2 times daily Use in each nostril as directed 60 mL 1     No current facility-administered medications for this visit.       Allergies   Allergen Reactions    Latex Other (See Comments)    Moxifloxacin Shortness Of Breath       BP 120/84   Pulse 78   Temp 97.1 F (36.2 C)   Resp 16   Wt 55.7 kg (122 lb 12.8 oz)   LMP 01/27/2022   SpO2 98%   BMI 23.20 kg/m     Social History     Tobacco Use    Smoking status: Never    Smokeless tobacco: Never   Substance Use Topics    Alcohol use: Yes     Comment: occasional       Review of Systems   Constitutional:  Negative  for activity change and fatigue.   Respiratory: Negative.     Cardiovascular: Negative.    Gastrointestinal: Negative.    Skin: Negative.    Psychiatric/Behavioral:  Negative for decreased concentration, dysphoric mood, self-injury, sleep disturbance and suicidal ideas. The patient is nervous/anxious.        Physical Exam  Constitutional:       Appearance: Normal appearance.   HENT:      Head: Normocephalic and atraumatic.      Right Ear: External ear normal.      Left Ear: External ear normal.      Nose: Nose normal. No rhinorrhea.      Mouth/Throat:      Pharynx: Oropharynx is clear.   Eyes:      General:         Right eye: No discharge.         Left eye: No discharge.      Conjunctiva/sclera:  Conjunctivae normal.   Neck:      Trachea: Phonation normal.   Cardiovascular:      Rate and Rhythm: Normal rate.   Pulmonary:      Effort: Pulmonary effort is normal. No respiratory distress.   Musculoskeletal:      Cervical back: Normal range of motion.   Skin:     Coloration: Skin is not jaundiced or pale.   Neurological:      General: No focal deficit present.      Mental Status: She is alert and oriented to person, place, and time.   Psychiatric:         Mood and Affect: Mood normal.         Behavior: Behavior normal.         Thought Content: Thought content normal.         Judgment: Judgment normal.         Diagnosis       ICD-10-CM    1. Anxiety  F41.9            Plan    Continue current 10 dose of lexapro  Refill sent to pharmacy  Continue self care and coping strategies  Follow up in 6 months for physical    No orders of the defined types were placed in this encounter.      Orders Placed This Encounter   Medications    escitalopram (LEXAPRO) 10 MG tablet     Sig: TAKE 1 TABLET BY MOUTH EVERY DAY     Dispense:  90 tablet     Refill:  1       Patient Education:  plan    Return in about 6 months (around 08/12/2022) for annual physical.

## 2022-02-17 ENCOUNTER — Encounter: Payer: BLUE CROSS/BLUE SHIELD | Attending: Acute Care | Primary: Acute Care

## 2022-02-18 ENCOUNTER — Encounter: Payer: BLUE CROSS/BLUE SHIELD | Attending: Acute Care | Primary: Acute Care

## 2022-03-30 ENCOUNTER — Encounter
Payer: BLUE CROSS/BLUE SHIELD | Attending: Student in an Organized Health Care Education/Training Program | Primary: Acute Care

## 2022-03-30 NOTE — Progress Notes (Deleted)
Janice  [place], [place] [id number]  Tel: [phone]      03/30/2022   SUBJECTIVE/OBJECTIVE  HPI    Janice SCHLOTTMAN (DOB:  [birth year]) is a 41 y.o. female, here for evaluation of the following medical concerns:  No chief complaint on file.    Patient presents with chief plaint is a corn between her toes.    HPI    Review of Systems  As above.    Prior to Visit Medications    Medication Sig Taking? Authorizing Provider   escitalopram (LEXAPRO) 10 MG tablet TAKE 1 TABLET BY MOUTH EVERY DAY  Grantmaier, Dionne Bucy, APRN - CNP   valACYclovir (VALTREX) 1 g tablet Take two tablets every 12 hours for 1 day.  Grantmaier, Dionne Bucy, APRN - CNP   Fexofenadine HCl (ALLEGRA ALLERGY PO) Take by mouth  [provider]   azelastine (ASTELIN) 0.1 % nasal spray 1 spray by Nasal route 2 times daily Use in each nostril as directed  Maricela Curet, APRN - CNP          Physical exam  There were no vitals taken for this visit.  Physical Exam     foot:  diffuse skin thickening on ?plantar aspect of prominent metatarsal??dorsal aspect of toe joint     ASSESSMENT/PLAN:  {There are no diagnoses linked to this encounter. (Refresh or delete this SmartLink)}   No follow-ups on file.  Patient has a corn/callus to the foot. Patient was instructed on how to soften the corn callus and. With a callus remover or pumice stone on her own or can adversely come to the clinic for a period. Today we used a #15 blade scalpel to pared down The callus as much as possible. It is recommended to use a corn pad or salicylic acid plaster pad to the corn to keep it soft.     There are no Patient Instructions on file for this visit.      An electronic signature was used to authenticate this note.  --Pamala Duffel, MD on 03/30/2022

## 2022-04-13 MED ORDER — NAPROXEN 500 MG PO TABS
500 MG | ORAL_TABLET | ORAL | 1 refills | Status: AC
Start: 2022-04-13 — End: 2022-06-11

## 2022-05-31 ENCOUNTER — Inpatient Hospital Stay: Admit: 2022-05-31 | Payer: BLUE CROSS/BLUE SHIELD | Primary: Acute Care

## 2022-05-31 ENCOUNTER — Encounter

## 2022-05-31 DIAGNOSIS — R928 Other abnormal and inconclusive findings on diagnostic imaging of breast: Secondary | ICD-10-CM

## 2022-06-11 MED ORDER — ESCITALOPRAM OXALATE 10 MG PO TABS
10 | ORAL_TABLET | Freq: Every day | ORAL | 1 refills | Status: DC
Start: 2022-06-11 — End: 2022-11-08

## 2022-06-11 NOTE — Telephone Encounter (Signed)
Future Appointments   Date Time Provider [place]   11/22/2022  2:00 PM [facility]     LOV 02/10/2022

## 2022-07-09 NOTE — Telephone Encounter (Signed)
Last ov 02/18/2021   Future Appointments   Date Time Provider [place]   11/22/2022  2:00 PM [facility]

## 2022-07-26 ENCOUNTER — Encounter
Admit: 2022-07-26 | Discharge: 2022-07-26 | Payer: BLUE CROSS/BLUE SHIELD | Attending: Otolaryngology | Primary: Acute Care

## 2022-07-26 DIAGNOSIS — H6122 Impacted cerumen, left ear: Secondary | ICD-10-CM

## 2022-07-26 MED ORDER — BETAMETHASONE SOD PHOS & ACET 6 (3-3) MG/ML IJ SUSP
6 | Freq: Once | INTRAMUSCULAR | Status: AC
Start: 2022-07-26 — End: 2022-07-26
  Administered 2022-07-26: 15:00:00 6 mg via INTRAMUSCULAR

## 2022-07-26 NOTE — Progress Notes (Signed)
FOLLOW UP VISIT:    CHIEF COMPLAINT: Inhalant allergies.    INTERIM HISTORY: Seasonal inhalant allergy symptoms of epiphora, pruritus, sneezing, rhinorrhea.  Patient has had intramuscular corticosteroids in the past with excellent control of symptoms.  In addition, she has fullness in both ears.  Her hearing is muffled.      PAST MEDICAL HISTORY:   Social History     Tobacco Use   Smoking Status Never   Smokeless Tobacco Never                                                    Social History     Substance and Sexual Activity   Alcohol Use Yes    Comment: occasional                                                    Current Outpatient Medications:     escitalopram (LEXAPRO) 10 MG tablet, Take 1 tablet by mouth daily, Disp: 90 tablet, Rfl: 1    valACYclovir (VALTREX) 1 g tablet, Take two tablets every 12 hours for 1 day., Disp: 4 tablet, Rfl: 2    Fexofenadine HCl (ALLEGRA ALLERGY PO), Take by mouth, Disp: , Rfl:     azelastine (ASTELIN) 0.1 % nasal spray, 1 spray by Nasal route 2 times daily Use in each nostril as directed, Disp: 60 mL, Rfl: 1                                                 Past Medical History:   Diagnosis Date    Allergic rhinitis     Allergies     on allegra    Basal cell carcinoma     Dizziness     GAD (generalized anxiety disorder)     Migraines     TMJ dysfunction                                                     Past Surgical History:   Procedure Laterality Date    CESAREAN SECTION         FAMILY HISTORY: Family history reviewed and except as pertinent to the interim history is not contributory.      REVIEW OF SYSTEMS:  All pertinent positive and negative findings included in HPI.  Otherwise, all other systems are reviewed and are negative    PHYSICAL EXAMINATION:   GENERAL: wdwn- no acute distress  RESPIRATORY: No stridor.  COMMUNICATION :  Normal voice  MENTAL STATUS: Alert and oriented x3  HEAD AND FACE: No skin lesions of the face.  EXTERNAL EARS AND NOSE: The external ears and nasal  pyramid are normal.  FACIAL MUSCLES: All branches of the facial nerve intact.  FACE PALPATION: Zygomatic arches and orbital rims are intact.  No tenderness over the sinuses.  EXTRAOCULAR MUSCLES: Intact with full range of motion.  OTOSCOPY: Cerumen occludes  both external auditory canals.  Cerumen removed from both external auditory canals using curettes.  The tympanic membranes and middle ear spaces are normal.  TUNING FORKS:  Rinne ++ Weber midline at 512 Hz  INTRANASAL:  Septum to right, turbinates are edematous with pale cast., meati clear.  LIPS, TEETH, GINGIVA:  Normal mucosa  PHARYNX:  Normal  NECK:  No masses.  LYMPH NODES: No cervical lymphadenopathy.  SALIVARY GLANDS: Parotid and submandibular glands normal.  THYROID: No goiter or thyroid nodules palpable.    IMPRESSION: Allergic rhinitis.    PLAN: Betamethasone 6 mg given intramuscularly.    FOLLOW-UP: As needed.

## 2022-09-10 MED ORDER — NAPROXEN 500 MG PO TABS
500 | ORAL_TABLET | ORAL | 1 refills | Status: DC
Start: 2022-09-10 — End: 2022-11-08

## 2022-09-10 NOTE — Telephone Encounter (Signed)
Future Appointments   Date Time Provider Department Center   11/22/2022  2:00 PM [facility]           LOV 02/10/2022

## 2022-11-08 MED ORDER — NAPROXEN 500 MG PO TABS
500 | ORAL_TABLET | ORAL | 0 refills | Status: AC
Start: 2022-11-08 — End: ?

## 2022-11-08 MED ORDER — ESCITALOPRAM OXALATE 10 MG PO TABS
10 | ORAL_TABLET | Freq: Every day | ORAL | 0 refills | Status: AC
Start: 2022-11-08 — End: ?

## 2022-11-08 NOTE — Telephone Encounter (Signed)
Pt is due for annual physical and will need labs

## 2022-11-08 NOTE — Telephone Encounter (Signed)
UPDATED PHARMACY SINCE [facility].    02/10/2022    Future Appointments   Date Time Provider Department Center   11/22/2022  2:00 PM [facility]

## 2022-11-22 ENCOUNTER — Inpatient Hospital Stay: Admit: 2022-11-22 | Payer: BLUE CROSS/BLUE SHIELD | Primary: Acute Care

## 2022-11-22 ENCOUNTER — Encounter

## 2022-11-22 DIAGNOSIS — R928 Other abnormal and inconclusive findings on diagnostic imaging of breast: Secondary | ICD-10-CM

## 2022-11-29 MED ORDER — VALACYCLOVIR HCL 1 G PO TABS
1 | ORAL_TABLET | ORAL | 0 refills | Status: AC
Start: 2022-11-29 — End: ?

## 2022-11-29 NOTE — Telephone Encounter (Signed)
02/10/2022    No future appointments.

## 2022-12-23 ENCOUNTER — Telehealth
Admit: 2022-12-23 | Discharge: 2022-12-23 | Payer: BLUE CROSS/BLUE SHIELD | Attending: Registered Nurse | Primary: Acute Care

## 2022-12-23 ENCOUNTER — Encounter: Payer: BLUE CROSS/BLUE SHIELD | Primary: Acute Care

## 2022-12-23 DIAGNOSIS — H10401 Unspecified chronic conjunctivitis, right eye: Secondary | ICD-10-CM

## 2022-12-23 NOTE — Progress Notes (Signed)
 Janice Oliver (DOB:  July 23, 1980) is a Established patient, here for evaluation of the following:    Conjunctivitis (X2 days, R eye)       Assessment & Plan:  Below is the assessment and plan developed based on review of pertinent history, physical exam, labs, studies, and medications.  1. Chronic conjunctivitis of right eye, unspecified chronic conjunctivitis type  Suspect viral etiology. Warm compresses, lubricating eye drops. Avoid contact lenses until resolved. Advised to contact the virtualist if symptoms worsen and antibiotic drops are indicated. Can send in polytrim.   Return if symptoms worsen or fail to improve.    Subjective:   Started yesterday. Right eye felt gritty, thought it was her contact. Woke up this AM with significant discharge, redness. Used eye drops for redness and irritation. Eyelid was puffy this AM. Had COVID two weeks ago and still feels congested.     Conjunctivitis   The current episode started yesterday. The onset was sudden. The problem has been gradually worsening. The problem is mild. The symptoms are relieved by one or more OTC medications. Nothing aggravates the symptoms. Associated symptoms include congestion, eye discharge (resolved), eye pain (irritating) and eye redness. Pertinent negatives include no eye itching, no photophobia, no headaches, no rhinorrhea, no sore throat and no swollen glands.     Review of Systems   HENT:  Positive for congestion. Negative for rhinorrhea and sore throat.    Eyes:  Positive for pain (irritating), discharge (resolved) and redness. Negative for photophobia and itching.   Neurological:  Negative for headaches.       Objective:  Patient-Reported Vitals  No data recorded         08/19/2020     9:50 AM   Patient-Reported Vitals   Patient-Reported Weight 112   Patient-Reported Height 5'2   Patient-Reported Temperature 98.1        Physical Exam:  [INSTRUCTIONS:  [x]  Indicates a positive item  []  Indicates a negative item  -- DELETE ALL ITEMS  NOT EXAMINED]    Constitutional: [x]  Appears well-developed and well-nourished [x]  No apparent distress      []  Abnormal -     Mental status: [x]  Alert and awake  [x]  Oriented to person/place/time [x]  Able to follow commands    []  Abnormal -     Eyes:   EOM    [x]   Normal    []  Abnormal -   Sclera  []   Normal    [x]  Abnormal - right eye with slight erythema. See image          Discharge [x]   None visible   []  Abnormal -       HENT: [x]  Normocephalic, atraumatic  []  Abnormal -   [x]  Mouth/Throat: Mucous membranes are moist    External Ears [x]  Normal  []  Abnormal -    Neck: [x]  No visualized mass []  Abnormal -     Pulmonary/Chest: [x]  Respiratory effort normal   [x]  No visualized signs of difficulty breathing or respiratory distress        []  Abnormal -      Musculoskeletal:   []  Normal gait with no signs of ataxia         [x]  Normal range of motion of neck        []  Abnormal -     Neurological:        [x]  No Facial Asymmetry (Cranial nerve 7 motor function) (limited exam due to video visit)          [  x] No gaze palsy        []  Abnormal -          Skin:        [x]  No significant exanthematous lesions or discoloration noted on facial skin         []  Abnormal -            Psychiatric:       [x]  Normal Affect []  Abnormal -        []  No Hallucinations    Other pertinent observable physical exam findings:-        On this date 12/23/2022 I have spent 15 minutes reviewing previous notes, test results and face to face (virtual) with the patient discussing the diagnosis and importance of compliance with the treatment plan as well as documenting on the day of the visit.    Janice Oliver, was evaluated through a synchronous (real-time) audio-video encounter. The patient (or guardian if applicable) is aware that this is a billable service, which includes applicable co-pays. This Virtual Visit was conducted with patient's (and/or legal guardian's) consent. Patient identification was verified, and a caregiver was present when  appropriate.   The patient was located at Home: [address]  [place] [address]  Provider was located at Home (Appt Dept State): [place]  Confirm you are appropriately licensed, registered, or certified to deliver care in the state where the patient is located as indicated above. If you are not or unsure, please re-schedule the visit: Yes, I confirm.        --LAURAINE CHRISTELLA STANFORD, APRN - CNP

## 2023-01-13 MED ORDER — VALACYCLOVIR HCL 1 G PO TABS
1 | ORAL_TABLET | ORAL | 0 refills | Status: DC
Start: 2023-01-13 — End: 2023-02-09

## 2023-01-13 MED ORDER — NAPROXEN 500 MG PO TABS
500 | ORAL_TABLET | ORAL | 0 refills | Status: DC
Start: 2023-01-13 — End: 2023-02-09

## 2023-01-13 NOTE — Telephone Encounter (Signed)
 02/10/22    No future appointments.

## 2023-01-13 NOTE — Telephone Encounter (Signed)
02/10/2022    No future appointments.

## 2023-01-18 NOTE — Telephone Encounter (Signed)
02/10/2022    No future appointments.

## 2023-01-19 MED ORDER — ESCITALOPRAM OXALATE 10 MG PO TABS
10 | ORAL_TABLET | Freq: Every day | ORAL | 0 refills | Status: DC
Start: 2023-01-19 — End: 2023-02-09

## 2023-01-19 NOTE — Telephone Encounter (Signed)
 LVM notifying and my chart sent

## 2023-01-19 NOTE — Telephone Encounter (Signed)
 Please call patient to schedule an appointment. She is due for her annual appointment

## 2023-02-03 NOTE — Telephone Encounter (Signed)
Future Appointments   Date Time Provider Department Center   02/09/2023  4:00 PM Grantmaier, Marzella Schlein, APRN - CNP [facility]

## 2023-02-09 ENCOUNTER — Ambulatory Visit: Admit: 2023-02-09 | Discharge: 2023-02-09 | Payer: BLUE CROSS/BLUE SHIELD | Attending: Acute Care | Primary: Acute Care

## 2023-02-09 DIAGNOSIS — Z0000 Encounter for general adult medical examination without abnormal findings: Secondary | ICD-10-CM

## 2023-02-09 MED ORDER — NAPROXEN 500 MG PO TABS
500 | ORAL_TABLET | ORAL | 5 refills | Status: DC
Start: 2023-02-09 — End: 2024-01-18

## 2023-02-09 MED ORDER — ESCITALOPRAM OXALATE 10 MG PO TABS
10 | ORAL_TABLET | Freq: Every day | ORAL | 2 refills | 30.00000 days | Status: DC
Start: 2023-02-09 — End: 2023-10-17

## 2023-02-09 MED ORDER — VALACYCLOVIR HCL 1 G PO TABS
1 | ORAL_TABLET | ORAL | 2 refills | Status: DC
Start: 2023-02-09 — End: 2023-11-07

## 2023-02-09 NOTE — Progress Notes (Signed)
Janice Oliver  Date of Birth:  [birth year]    Janice Oliver    Date of Service:  02/09/2023    Chief Complaint:   Janice Oliver is a 42 y.o. female who presents for complete physical examination.    HPI: Janice Oliver is seen today for physical. She notes intermmitent issues with constipation and rectal fissure    Wt Readings from Last 3 Encounters:   11/22/22 56.7 kg (125 lb)   07/26/22 56.7 kg (125 lb)   02/10/22 55.7 kg (122 lb 12.8 oz)       BP Readings from Last 3 Encounters:   07/26/22 105/66   02/10/22 120/84   11/06/21 113/67       Vitals:    02/09/23 1610   BP: 118/76   Pulse: 78   Resp: 16   Temp: 97.9 F (36.6 C)   SpO2: 98%   Weight: 58.2 kg (128 lb 6.4 oz)   Height: 1.57 m (5' 1.8")       Patient Active Problem List   Diagnosis    Basal cell carcinoma of skin    Generalized anxiety disorder    Migraine    Seasonal allergic rhinitis    Uterine scar from previous cesarean delivery, antepartum    Acquired hammer toe of left foot    Acquired hammer toe of right foot       Preventive Care:  Health Maintenance   Topic Date Due    Varicella vaccine (1 of 2 - 13+ 2-dose series) Never done    HIV screen  Never done    Hepatitis C screen  Never done    Hepatitis B vaccine (1 of 3 - 19+ 3-dose series) Never done    DTaP/Tdap/Td vaccine (1 - Tdap) Never done    Cervical cancer screen  Never done    Flu vaccine (1) 11/18/2022    Depression Screen  02/10/2023    Breast cancer screen  11/21/2024    Lipids  02/11/2027    COVID-19 Vaccine  Completed    Hepatitis A vaccine  Aged Out    Hib vaccine  Aged Out    HPV vaccine  Aged Out    Polio vaccine  Aged Out    Meningococcal (ACWY) vaccine  Aged Out    Pneumococcal 0-64 years Vaccine  Aged Out      Last eye exam: 2024, normal  Exercise: dance instructor, walking, hiking, pilates  Seatbelt use: yes  Lipid panel:   Lab Results   Component Value Date    CHOL 160 02/10/2022    TRIG 43 02/10/2022    HDL 72 (H) 02/10/2022      Screenings due: labs       Immunization  History   Administered Date(s) Administered    COVID-19, MODERNA BLUE border, Primary or Immunocompromised, (age 12y+), IM, 100 mcg/0.22mL 06/14/2019    COVID-19, PFIZER Bivalent, DO NOT Dilute, (age 12y+), IM, 30 mcg/0.3 mL 05/03/2021    COVID-19, PFIZER PURPLE top, DILUTE for use, (age 404 y+), 24mcg/0.3mL 06/14/2019, 07/05/2019, 02/09/2020    COVID-19, PFIZER, (age 12y+), IM, 17mcg/0.3mL 01/29/2022    Influenza, FLUARIX, FLULAVAL, FLUZONE (age 72 mo+) and AFLURIA, (age 85 y+), Quadv PF, 0.38mL 02/09/2020    Influenza, FLUCELVAX, (age 40 mo+), MDCK, Quadv PF, 0.58mL 02/18/2021       Allergies   Allergen Reactions    Latex Other (See Comments)    Moxifloxacin Shortness Of Breath       Current  Outpatient Medications   Medication Sig Dispense Refill    escitalopram (LEXAPRO) 10 MG tablet TAKE 1 TABLET BY MOUTH DAILY 90 tablet 0    naproxen (NAPROSYN) 500 MG tablet TAKE 1 TABLET BY MOUTH DAILY AS NEEDED FOR MILD PAIN 30 tablet 0    valACYclovir (VALTREX) 1 g tablet TAKE 2 TABLETS BY MOUTH EVERY 12 HOURS FOR 1 DAY 4 tablet 0    Fexofenadine HCl (ALLEGRA ALLERGY PO) Take by mouth      azelastine (ASTELIN) 0.1 % nasal spray 1 spray by Nasal route 2 times daily Use in each nostril as directed 60 mL 1     No current facility-administered medications for this visit.       Past Medical History:   Diagnosis Date    Allergic rhinitis     Allergies     on allegra    Basal cell carcinoma     Dizziness     GAD (generalized anxiety disorder)     Migraines     TMJ dysfunction        Past Surgical History:   Procedure Laterality Date    CESAREAN SECTION         Family History   Problem Relation Age of Onset    High Blood Pressure Mother     Diabetes Mother         Type 2    Hypertension Father     High Blood Pressure Father     Heart Disease Father     No Known Problems Sister     No Known Problems Sister     Other Brother         autoimmune disease    Hypertension Brother     High Blood Pressure Brother     Breast Cancer Maternal  Grandmother 83    Alcohol Abuse Maternal Grandfather     Breast Cancer Paternal Grandmother 55    Alzheimer's Disease Paternal Grandmother     No Known Problems Paternal Grandfather        Social History     Socioeconomic History    Marital status: Married     Spouse name: Not on file    Number of children: Not on file    Years of education: Not on file    Highest education level: Not on file   Occupational History    Not on file   Tobacco Use    Smoking status: Never    Smokeless tobacco: Never   Substance and Sexual Activity    Alcohol use: Yes     Comment: occasional    Drug use: Never    Sexual activity: Yes     Partners: Male   Other Topics Concern    Not on file   Social History Narrative    Not on file     Social Determinants of Health     Financial Resource Strain: Low Risk  (01/25/2020)    Overall Financial Resource Strain (CARDIA)     Difficulty of Paying Living Expenses: Not hard at all   Food Insecurity: Not on file (02/09/2022)   Transportation Needs: Not on file   Physical Activity: Sufficiently Active (10/10/2018)    Received from [employer] and [employer], [employer]    Exercise Vital Sign     Days of Exercise per Week: 4 days     Minutes of Exercise per Session: 60 min   Stress: Not on file   Social  Connections: Not on file   Intimate Partner Violence: Not on file   Housing Stability: Not on file       Review of Systems:  Review of Systems   Constitutional:  Negative for activity change, chills, fatigue, fever and unexpected weight change.   HENT:  Negative for ear discharge, mouth sores, postnasal drip, sinus pressure, sore throat and trouble swallowing.    Eyes:  Negative for redness, itching and visual disturbance.   Respiratory:  Negative for cough, chest tightness, shortness of breath and wheezing.    Cardiovascular:  Negative for chest pain, palpitations and leg swelling.   Gastrointestinal:  Negative for abdominal pain, constipation, diarrhea and  nausea.   Endocrine: Negative for cold intolerance, heat intolerance, polydipsia, polyphagia and polyuria.   Genitourinary:  Negative for dysuria, frequency and urgency.   Musculoskeletal:  Negative for arthralgias, joint swelling and myalgias.   Skin:  Negative for color change, pallor and rash.        Rare cold sores   Allergic/Immunologic: Negative for environmental allergies, food allergies and immunocompromised state.   Neurological:  Negative for dizziness, syncope, weakness and headaches.   Hematological:  Does not bruise/bleed easily.   Psychiatric/Behavioral:  Negative for behavioral problems, hallucinations and sleep disturbance. The patient is not nervous/anxious.        Physical Exam:     Body mass index is 23.64 kg/m.     Physical Exam  Vitals and nursing note reviewed.   Constitutional:       General: She is not in acute distress.     Appearance: Normal appearance. She is well-developed. She is not diaphoretic.   HENT:      Head: Normocephalic and atraumatic.      Right Ear: Hearing, tympanic membrane, ear canal and external ear normal.      Left Ear: Hearing, tympanic membrane, ear canal and external ear normal.      Nose: Nose normal.      Mouth/Throat:      Mouth: Mucous membranes are moist.      Pharynx: Uvula midline. No oropharyngeal exudate.   Eyes:      General: Lids are normal. No scleral icterus.        Right eye: No discharge.         Left eye: No discharge.      Conjunctiva/sclera: Conjunctivae normal.      Pupils: Pupils are equal, round, and reactive to light.   Neck:      Thyroid: No thyromegaly.      Vascular: No carotid bruit.      Trachea: Trachea and phonation normal.   Cardiovascular:      Rate and Rhythm: Normal rate and regular rhythm. No extrasystoles are present.     Heart sounds: Normal heart sounds, S1 normal and S2 normal. No murmur heard.     No friction rub. No gallop.   Pulmonary:      Effort: Pulmonary effort is normal. No respiratory distress.      Breath sounds: Normal  breath sounds. No decreased breath sounds, wheezing, rhonchi or rales.   Chest:      Chest wall: No tenderness.   Abdominal:      General: Bowel sounds are normal.      Palpations: Abdomen is soft. There is no mass.      Tenderness: There is no abdominal tenderness. There is no guarding.   Genitourinary:     Comments: deferred  Musculoskeletal:  General: No deformity. Normal range of motion.      Cervical back: Full passive range of motion without pain, normal range of motion and neck supple.   Lymphadenopathy:      Cervical: No cervical adenopathy.   Skin:     General: Skin is warm and dry.      Findings: No erythema or rash.   Neurological:      General: No focal deficit present.      Mental Status: She is alert and oriented to person, place, and time.      Sensory: No sensory deficit.      Motor: No abnormal muscle tone.      Coordination: Coordination normal.      Gait: Gait normal.      Deep Tendon Reflexes: Reflexes are normal and symmetric.      Reflex Scores:       Tricep reflexes are 2+ on the right side and 2+ on the left side.       Bicep reflexes are 2+ on the right side and 2+ on the left side.       Patellar reflexes are 2+ on the right side and 2+ on the left side.  Psychiatric:         Attention and Perception: Attention normal.         Mood and Affect: Mood and affect normal.         Speech: Speech normal.         Behavior: Behavior normal.         Thought Content: Thought content normal.         Cognition and Memory: Cognition normal.         Judgment: Judgment normal.         Assessment      ICD-10-CM    1. Annual physical exam  Z00.00 Comprehensive Metabolic Panel     LIPID PANEL     TSH with Reflex to FT4      2. Constipation, unspecified constipation type  K59.00 TSH with Reflex to FT4      3. Anxiety  F41.9 escitalopram (LEXAPRO) 10 MG tablet          Plan    Generally healthy adult  Continue medications at same doses  Will recheck labs    Orders Placed This Encounter   Medications     escitalopram (LEXAPRO) 10 MG tablet     Sig: Take 1 tablet by mouth daily     Dispense:  90 tablet     Refill:  2    naproxen (NAPROSYN) 500 MG tablet     Sig: TAKE 1 TABLET BY MOUTH DAILY AS NEEDED FOR MILD PAIN     Dispense:  30 tablet     Refill:  5    valACYclovir (VALTREX) 1 g tablet     Sig: TAKE 2 TABLETS BY MOUTH EVERY 12 HOURS FOR 1 DAY     Dispense:  4 tablet     Refill:  2     FILLING IN Nikash Mortensen'S ABSENCE        Orders Placed This Encounter   Procedures    Influenza, FLUCELVAX Trivalent, (age 54 mo+) IM, Preservative Free, 0.80mL    Comprehensive Metabolic Panel     Standing Status:   Future     Standing Expiration Date:   02/09/2024    LIPID PANEL     Standing Status:   Future     Standing Expiration Date:  02/09/2024    TSH with Reflex to FT4     Standing Status:   Future     Standing Expiration Date:   02/09/2024       Patient Education:    Counseled on importance of healthy diet and regular exercise of at least 30 minutes on four or more days during the week.  Counseled on skin safety, SPF 30 or higher prior to going outdoors and reapplication every twohours while outside. Monitor moles for changes, report to provider if greater than 6 mm, color variations, asymmetry, redness, scales, and/or overlying skin changes    Counseled on safety, wear seatbelt, do not consumealcohol and drive or drive with anyone who has consumed alcohol    Counseled on importance of monthly self breast exams, perform on same time each month, monitor for newlumps/bumps/masses, tenderness, changes to size or contour, dimpling, nipple discharge, new nipple retraction, and overlying skin changes, report to provider      Follow Up  1 year

## 2023-02-11 ENCOUNTER — Encounter

## 2023-02-11 LAB — LIPID PANEL
Cholesterol, Total: 180 mg/dL (ref 0–199)
HDL: 77 mg/dL — ABNORMAL HIGH (ref 40–60)
LDL Cholesterol: 92 mg/dL (ref <100)
Triglycerides: 56 mg/dL (ref 0–150)
VLDL Cholesterol Calculated: 11 mg/dL

## 2023-02-11 LAB — COMPREHENSIVE METABOLIC PANEL
ALT: 18 U/L (ref 10–40)
AST: 19 U/L (ref 15–37)
Albumin/Globulin Ratio: 2.3 — ABNORMAL HIGH (ref 1.1–2.2)
Albumin: 4.4 g/dL (ref 3.4–5.0)
Alkaline Phosphatase: 51 U/L (ref 40–129)
Anion Gap: 8 (ref 3–16)
BUN: 13 mg/dL (ref 7–20)
CO2: 26 mmol/L (ref 21–32)
Calcium: 9 mg/dL (ref 8.3–10.6)
Chloride: 104 mmol/L (ref 99–110)
Creatinine: 0.6 mg/dL (ref 0.6–1.1)
Est, Glom Filt Rate: >90 (ref >60)
Glucose: 88 mg/dL (ref 70–99)
Potassium: 4.4 mmol/L (ref 3.5–5.1)
Sodium: 138 mmol/L (ref 136–145)
Total Bilirubin: 0.5 mg/dL (ref 0.0–1.0)
Total Protein: 6.3 g/dL — ABNORMAL LOW (ref 6.4–8.2)

## 2023-02-11 LAB — TSH WITH REFLEX TO FT4: TSH Reflex FT4: 2.87 u[IU]/mL (ref 0.27–4.20)

## 2023-02-27 ENCOUNTER — Ambulatory Visit
Admit: 2023-02-27 | Discharge: 2023-02-27 | Payer: BLUE CROSS/BLUE SHIELD | Attending: Physician Assistant | Primary: Acute Care

## 2023-02-27 VITALS — BP 132/86 | HR 80 | Temp 98.70000°F | Ht 61.0 in | Wt 128.0 lb

## 2023-02-27 DIAGNOSIS — L03011 Cellulitis of right finger: Secondary | ICD-10-CM

## 2023-02-27 MED ORDER — DOXYCYCLINE HYCLATE 100 MG PO TABS
100 | ORAL_TABLET | Freq: Two times a day (BID) | ORAL | 0 refills | Status: AC
Start: 2023-02-27 — End: 2023-03-06

## 2023-02-27 NOTE — Progress Notes (Unsigned)
Janice Oliver (DOB: 03-04-1981) is a 42 y.o. female, New patient, here for evaluation of the following chief complaint(s):  Nail Problem (Right middle finger cuticle infection, sx started Tuesday. )      ASSESSMENT/PLAN:  No diagnosis found.    ***  Discussed PCP follow up for persisting or worsening symptoms, or to return to the clinic if unable to obtain PCP follow up for worsening symptoms.    The patient tolerated their visit well. The patient and/or the family were informed of the results of any tests, a time was given to answer questions, a plan was proposed and they agreed with plan. Reviewed AVS with treatment instructions and answered questions - pt/family expresses understanding and agreement with the discussed treatment plan and AVS instructions.      SUBJECTIVE/OBJECTIVE:  HPI:   42 y.o. female presents for complaint of possible finger infection of the right middle finger x 5 days.    Notes started to note swelling, redness, and pain develop around the border of the right middle finger nail roughly 5 days ago.   Notes not getting better and notes finger is starting to hurt more, especially after having done more house work over this weekend.    Denies draining, purulence, increasing swelling into the finger/hand, red streaks, fevers, n/v/d, body aches, and headaches.    Nothing makes symptoms better or worse.    Has attempted nothing for symptoms at this time.       History provided by:  Patient  Language interpreter used: No        VITAL SIGNS  Vitals:    02/27/23 1625   BP: (!) 132/90   Pulse: 80   Temp: 98.7 F (37.1 C)   SpO2: 96%   Weight: 58.1 kg (128 lb)   Height: 1.549 m (5\' 1" )       Review of Systems  See HPI for pertinent positives and negatives.    Physical Exam  Vitals reviewed.   Constitutional:       General: She is not in acute distress.     Appearance: Normal appearance. She is not ill-appearing.   HENT:      Head: Normocephalic and atraumatic.      Right Ear: External ear  normal.      Left Ear: External ear normal.      Nose: Nose normal.      Mouth/Throat:      Mouth: Mucous membranes are moist.   Eyes:      General: Vision grossly intact. Gaze aligned appropriately.      Extraocular Movements: Extraocular movements intact.      Conjunctiva/sclera: Conjunctivae normal.      Pupils: Pupils are equal, round, and reactive to light.   Cardiovascular:      Rate and Rhythm: Normal rate and regular rhythm.      Heart sounds: Normal heart sounds.   Pulmonary:      Effort: Pulmonary effort is normal.      Breath sounds: Normal breath sounds.   Musculoskeletal:      Cervical back: Normal range of motion and neck supple.   Skin:     General: Skin is warm and dry.      Findings: Abscess (not fluctuant***) present.   Neurological:      Mental Status: She is alert and oriented to person, place, and time.   Psychiatric:         Attention and Perception: Attention normal.  Behavior: Behavior is cooperative.       PROCEDURES:  Unless otherwise noted below, none     Procedures    RESULTS:  No results found for this visit on 02/27/23.  An electronic signature was used to authenticate this note.    --Domenica Reamer, PA-C ***refresh***

## 2023-02-27 NOTE — Patient Instructions (Addendum)
Doxycycline is prescribed for antibiotic treatment of the skin infection  Take the antibiotics until completed, do not stop unless otherwise directed by a healthcare provider.  Recommend daily yogurt or other probiotics while on antibiotics.  Use hot compresses/warm soaks several times per day over the site, as well as any wound care.  Ibuprofen and/or acetaminophen for pain/swelling.  If swelling persists despite antibiotic treatment, to return to the clinic for further evaluation.  If fevers, body aches, nausea, vomiting, or shivering develop, or if swelling continues to expand, follow up with the ED for further evaluation.    New Prescriptions    DOXYCYCLINE HYCLATE (VIBRA-TABS) 100 MG TABLET    Take 1 tablet by mouth 2 times daily for 7 days

## 2023-03-24 NOTE — Telephone Encounter (Signed)
 [facility] [phone] [place], [place] - [address] - P [phone] - F [phone] [[id number]]     02/09/2023     .  Future Appointments   Date Time Provider Department Center   02/10/2024  4:00 PM Grantmaier, Marzella Schlein, APRN - CNP [facility]

## 2023-03-24 NOTE — Telephone Encounter (Signed)
 Called & spoke to pt  She states she must have jumped the gun & didn't check with her pharm 1st.  She will call them for the refill.

## 2023-03-24 NOTE — Telephone Encounter (Signed)
 Please call the patient and ask if she needed three rounds of antiviral treatment since 02/09/23. That prescription had 2 refills. If she is needing medication that frequently we may need to discuss daily medication.

## 2023-08-31 NOTE — Progress Notes (Signed)
 Formatting of this note is different from the original.  Subjective   Subjective:  Janice Oliver is a 43 year old female who presents today for annual exam.     Is sexually active, female partner, declines STI cultures & bloodwork today  Peri/menopausal sx: N/A    Preventive Care   Pap History: 03/23/22 - normal, HPV negative    Last Mammogram: in the past 6 months - normal   Last Colonoscopy: N/A  Last DEXA: N/A    OB History   Gravida Para Term Preterm AB Living   2 2 2  0 0 2   SAB IAB Ectopic Multiple Live Births   0 0 0 0 2     # Outcome Date GA Lbr Len/2nd Weight Sex Type Anes PTL Lv   2 Term 12/12/13 [redacted]w[redacted]d   F VBAC   LIV   1 Term 12/17/11 [redacted]w[redacted]d   M C-Section        Menstrual & GYN History:  Patient's last menstrual period was 08/20/2023.  Periods are regular q 28-30 days, lasting 4 days, normal flow  Dysmenorrhea :none.   Cyclic symptoms include none.   No intermenstrual bleeding, spotting, or discharge.    Any abnormal pap smears?  no  History of sexually transmitted diseases?  no  Abnormal discharge/itching/odor? no    Contraception Used:Vasectomy    Health Maintenance   Topic Date Due    DTap,Tdap,and Td (1 - Tdap) Never done    Mammogram Screening  11/01/2020    COVID-19 Vaccine (6 - 2024-25 season) 12/19/2022    Pap Screening  03/23/2025    RSV Vaccine (60+ or Pregnant) (1 - 1-dose 75+ series) 01/14/2054    Influenza Vaccine  Completed    HPV  Aged Out    Meningococcal conjugate valent 4 (MCV4)  Aged Out    RSV Immunization (<20 months)  Aged Out    Pneumococcal 0-49  Aged Out     Past Medical History:   Diagnosis Date    Fibrocystic breast changes, bilateral     Generalized anxiety disorder     H/O cold sores     Migraine     Migraines     Mild tricuspid regurgitation     VBAC (vaginal birth after Cesarean)      Past Surgical History:   Procedure Laterality Date    CESAREAN SECTION  11/2011       Social History     Tobacco Use    Smoking status: Never    Smokeless tobacco: Never    Tobacco comments:      Socially   Vaping Use    Vaping status: Never Used   Substance Use Topics    Alcohol use: Not Currently    Drug use: Yes     Types: Marijuana     Comment: OCC     Social History     Tobacco Use   Smoking Status Never   Smokeless Tobacco Never   Tobacco Comments    Socially     Counseling given: Not Answered  Tobacco comments: Socially    Family History   Problem Relation Age of Onset    High BP Mother     Diabetes Mother     High BP Father     Heart Disease Father     High BP Brother      Allergies   Allergen Reactions    Avelox [Moxifloxacin] Shortness of Breath    Tape Adhesive Rash  Outpatient Medications Marked as Taking for the 08/31/23 encounter (Office Visit) with Reginald, Delon Helling, CNM   Medication Sig Dispense Refill    naproxen  (NAPROSYN ) 500 MG TABS TAKE 1 TABLET BY MOUTH DAILY AS NEEDED FOR MILD PAIN (1-3). 30 tablet 3    escitalopram  (LEXAPRO ) 5 MG TABS TAKE 1 TABLET BY MOUTH EVERY DAY 90 tablet 1    valacyclovir  (VALTREX ) 1 g tablet Take 1 tablet by mouth 2 (two) times daily as needed. 30 tablet 0    hydrOXYzine HCl (ATARAX) 10 MG TABS Take 1 tablet by mouth 2 (two) times daily as needed. 60 tablet 0    Loratadine (CLARITIN PO) Take  by mouth.      Multiple Vitamin (MULTI VITAMIN DAILY PO) Take  by mouth.      MAGNESIUM  CARBONATE PO Take  by mouth.      Cyanocobalamin (VITAMIN B 12 PO) Take  by mouth.      Omega-3 Fatty Acids (FISH OIL) 500 MG CAPS Take 500 mg by mouth daily with breakfast.       ROS  Review of Systems   Constitutional: Negative.    HENT: Negative.    Eyes: Negative.    Respiratory: Negative.    Cardiovascular: Negative.    Gastrointestinal: Negative.    Genitourinary: Negative.    Musculoskeletal: Negative.    Skin: Negative.    Neurological: Negative.    Endo/Heme/Allergies: Negative.    Psychiatric/Behavioral: Negative.    All other systems reviewed and are negative.    Vitals:    08/31/23 1616   BP: 110/78   Weight: 130 lb (59 kg)   Height: 62 (157.5 cm)       Objective      Objective:    Physical Exam  Physical Exam  Constitutional:       Appearance: She is well-developed.   HENT:      Head: Normocephalic.   Neck:      Thyroid: No thyromegaly.      Trachea: No tracheal deviation.   Cardiovascular:     Pulmonary:      Effort: Pulmonary effort is normal. No respiratory distress.     Chest:   Breasts:     Breasts are symmetrical.      Right: No inverted nipple, mass, nipple discharge, skin change or tenderness.      Left: No inverted nipple, mass, nipple discharge, skin change or tenderness.   Abdominal:      General: There is no distension.      Palpations: Abdomen is soft.      Tenderness: There is no abdominal tenderness.   Genitourinary:     Exam position: Supine.      Labia:         Right: No rash, tenderness, lesion or injury.         Left: No rash, tenderness, lesion or injury.       Vagina: Normal. No signs of injury and foreign body,     Cervix: No cervical motion tenderness      Uterus: Not deviated, not enlarged, not fixed and not tender.       Adnexa:         Right: No mass, tenderness or fullness.          Left: No mass, tenderness or fullness.     Musculoskeletal:         General: Normal range of motion.      Cervical back: Normal range of motion  and neck supple.   Skin:     General: Skin is warm and dry.   Neurological:      Mental Status: She is alert and oriented to person, place, and time.   Psychiatric:         Behavior: Behavior normal.         Thought Content: Thought content normal.         Judgment: Judgment normal.       Assessment & Plan:    Epifanio was seen today for annual exam.    Diagnoses and all orders for this visit:    Encounter for gynecological examination without abnormal finding    -Pap smear up to date  -Discussed breast self awareness   -Recommend annual mammography screening   -Recommend colonoscopy for colon cancer screening to begin at 34 yoa  -Reviewed care of self: nutrition, weight bearing exercise, self breast exam, Calcium supplements,  Vitamin D supplements.  -Encouraged safe sex practices & condom use for STI prevention  -Enc to follow up w/PCP for regularly scheduled well visits.  -Enc annual dental appointment  -RTO 1 year for annual exam or PRN for issues    Delon Macario Labrador, CNM  Electronically signed by Labrador Delon Macario, CNM at 08/31/2023  4:45 PM EDT

## 2023-10-15 ENCOUNTER — Encounter

## 2023-10-17 MED ORDER — ESCITALOPRAM OXALATE 10 MG PO TABS
10 | ORAL_TABLET | Freq: Every day | ORAL | 1 refills | 90.00000 days | Status: DC
Start: 2023-10-17 — End: 2024-04-13

## 2023-10-17 NOTE — Telephone Encounter (Signed)
 Refill Request     Last Seen: Last Seen Department: 02/09/2023  Last Seen by PCP: 02/09/2023    Last Written: 02/09/2023      Next Appointment:   Future Appointments   Date Time Provider Department Center   02/10/2024  4:00 PM Grantmaier, Jon HERO, APRN - CNP [facility]       Requested Prescriptions     Pending Prescriptions Disp Refills    escitalopram  (LEXAPRO ) 10 MG tablet [Pharmacy Med Name: ESCITALOPRAM  10MG  TABLETS] 90 tablet 2     Sig: TAKE 1 TABLET BY MOUTH DAILY

## 2023-11-07 MED ORDER — VALACYCLOVIR HCL 1 G PO TABS
1 | ORAL_TABLET | ORAL | 2 refills | 30.00000 days | Status: DC
Start: 2023-11-07 — End: 2024-05-06

## 2023-12-20 ENCOUNTER — Encounter

## 2023-12-20 ENCOUNTER — Inpatient Hospital Stay: Admit: 2023-12-20 | Payer: BLUE CROSS/BLUE SHIELD | Primary: Acute Care

## 2023-12-20 VITALS — Ht 62.0 in | Wt 125.0 lb

## 2023-12-20 DIAGNOSIS — R928 Other abnormal and inconclusive findings on diagnostic imaging of breast: Principal | ICD-10-CM

## 2024-01-17 NOTE — Telephone Encounter (Signed)
 Future Appointments   Date Time Provider Department Center   03/19/2024  2:00 PM Jan Jon HERO, APRN - CNP [facility]     LOV 02/09/2023

## 2024-01-18 MED ORDER — NAPROXEN 500 MG PO TABS
500 | ORAL_TABLET | ORAL | 5 refills | 30.00000 days | Status: AC
Start: 2024-01-18 — End: ?

## 2024-01-24 ENCOUNTER — Ambulatory Visit: Admit: 2024-01-24 | Discharge: 2024-01-28 | Payer: BLUE CROSS/BLUE SHIELD | Primary: Acute Care

## 2024-01-24 ENCOUNTER — Ambulatory Visit
Admit: 2024-01-24 | Discharge: 2024-01-24 | Payer: BLUE CROSS/BLUE SHIELD | Attending: Physician Assistant | Primary: Acute Care

## 2024-01-24 VITALS — Ht 62.01 in | Wt 125.0 lb

## 2024-01-24 DIAGNOSIS — R52 Pain, unspecified: Principal | ICD-10-CM

## 2024-01-24 MED ORDER — NAPROXEN 500 MG PO TABS
500 | ORAL_TABLET | Freq: Two times a day (BID) | ORAL | 1 refills | Status: AC
Start: 2024-01-24 — End: ?

## 2024-01-24 NOTE — Progress Notes (Signed)
 "     Dr Burgess Pitter      Date /Time 01/24/2024       3:41 PM EDT  Name Janice Oliver             1980/10/22   Location  [facility]  MRN [id number]                Chief Complaint   Patient presents with    New Patient     Op Np L Knee        History of Present Illness  Janice Oliver is a 43 y.o. female who presents with  left knee pain, .    Sent in consultation by Jan Jon HERO, APRN - CNP, .    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  Injury Mechanism:  none.  Worker's Comp. & legal issues:   none.  Previous Treatments: Ice, Heat, and NSAIDs    Patient presents to the office today for a new problem.  Patient here with a chief complaint of left knee pain.  Patient has had no specific injury or trauma.  She does complain of increased pain with activities and improvement with rest.  Patient was ordered naproxen  by her primary care for migraine prevention.  She only takes it once a day.  Today she states that her knee feels better than it has in 3 months.  She does complain of global knee pain with some concentration anterior and posterior.    Past History  Past Medical History:   Diagnosis Date    Allergic rhinitis     Allergies     on allegra    Basal cell carcinoma     Dizziness     GAD (generalized anxiety disorder)     Migraines     TMJ dysfunction      Past Surgical History:   Procedure Laterality Date    CESAREAN SECTION       Social History     Tobacco Use    Smoking status: Never    Smokeless tobacco: Never   Substance Use Topics    Alcohol use: Yes     Comment: occasional      Current Outpatient Medications on File Prior to Visit   Medication Sig Dispense Refill    naproxen  (NAPROSYN ) 500 MG tablet TAKE 1 TABLET BY MOUTH DAILY AS NEEDED FOR MILD PAIN 30 tablet 5    valACYclovir  (VALTREX ) 1 g tablet TAKE 2 TABLETS BY MOUTH EVERY 12 HOURS FOR 1 DAYTAKE 2 TABLETS BY MOUTH EVERY 12 HOURS FOR 1 DAY 4 tablet 2     escitalopram  (LEXAPRO ) 10 MG tablet TAKE 1 TABLET BY MOUTH DAILY 90 tablet 1    Fexofenadine HCl (ALLEGRA ALLERGY PO) Take by mouth       No current facility-administered medications on file prior to visit.      ASCVD 10-YEAR RISK SCORE  The 10-year ASCVD risk score (Arnett DK, et al., 2019) is: 0.4%    Values used to calculate the score:      Age: 74 years      Clinically relevant sex: Female      Is Non-Hispanic African American: No      Diabetic: No      Tobacco smoker: No      Systolic Blood Pressure: 132 mmHg      Is BP treated: No      HDL Cholesterol: 77  mg/dL      Total Cholesterol: 180 mg/dL     Review of Systems  10-point ROS is negative other than HPI.    Physical Exam  Based off 1997 Exam Criteria  Ht 1.575 m (5' 2.01)   Wt 56.7 kg (125 lb)   BMI 22.86 kg/m      Constitutional:       General: He is not in acute distress.     Appearance: Normal appearance.   Cardiovascular:      Rate and Rhythm: Normal rate and regular rhythm.      Pulses: Normal pulses.   Pulmonary:      Effort: Pulmonary effort is normal. No respiratory distress.   Neurological:      Mental Status: He is alert and oriented to person, place, and time. Mental status is at baseline.   Skin: Mean, dry, intact.  No open sores  Lymphatics: No palpable lymph nodes    Musculoskeletal:  Gait:  altered  Lumbar spine: There is no swelling, warmth, or erythema. Range of motion is within normal limits. There is no paraspinal or spinous process tenderness. . The distal neurovascular exam is grossly intact and symmetric.  Bil Hip: Examination of the right and left hip reveals intact skin. The patient demonstrates full painless range of motion with regards to flexion, abduction, internal and external rotation. There is no tenderness about the greater trochanter.    R Knee: Examination of the knee reveals intact skin. There is no focal tenderness. The patient demonstrates full painless range of motion with regard to flexion and extension.  L Knee:  Physical exam of the knee demonstrates a negative knee joint effusion.  No significant tenderness over the medial or lateral joint line.  Positive patellofemoral crepitations.  Positive patellofemoral grind test with tenderness over the lateral patella facet.  Negative McMurray sign.  No instability either varus valgus stress test.  Negative anterior drawer      Imaging  Left Knee: [facility]  Radiographs: X-rays were ordered today and reviewed of the left knee.  4 views.  Standing AP, standing AP flexed, lateral, and skyline views.  They demonstrate no evidence of fractures or dislocations with well-maintained joint space      Procedure:  Orders Placed This Encounter   Procedures    XR KNEE LEFT (MIN 4 VIEWS)     Bansal Views       Assessment and Plan  Janice Oliver was seen today for new patient.    Diagnoses and all orders for this visit:    Pain  -     XR KNEE LEFT (MIN 4 VIEWS)    Patellofemoral pain syndrome of left knee  -     Ambulatory referral to Physical Therapy    Other orders  -     naproxen  (NAPROSYN ) 500 MG tablet; Take 1 tablet by mouth 2 times daily (with meals)        Patient suffering with patellofemoral syndrome.  We will increase her Naprosyn  briefly to twice a day until her knee is feeling better.  She will attend 1 session of physical therapy for home exercises.  Will see her back in 6 weeks.  If in 6 weeks not doing well consideration for MRI    I discussed with Janice Oliver that her history, symptoms, signs, and imaging are most consistent with patellofemoral syndrome.    We reviewed the natural history of these conditions and treatment options ranging from conservative  measures (rest, icing, activity modification, physical therapy, pain meds) to surgical options.     In terms of treatment, I recommended continuing with rest, icing, avoidance of painful activities, NSAIDs or pain meds as tolerated, and physical therapy.     We discussed surgical options as well, should  conservative measures fail.     Electronically signed by Janice GORMAN Netter, PA-C on 01/24/2024 at 3:41 PM  This dictation was generated by voice recognition computer software.  Although all attempts are made to edit the dictation for accuracy, there may be errors in the transcription that are not intended.  "

## 2024-02-10 ENCOUNTER — Encounter: Payer: BLUE CROSS/BLUE SHIELD | Attending: Acute Care | Primary: Acute Care

## 2024-03-09 NOTE — Telephone Encounter (Signed)
"-----   Message from Your Healthcare Team sent at 03/09/2024  2:32 PM EST -----  Regarding: [facility] Appointment Request  [facility] Appointment Request    Patient needs appointment for [facility] Appointment Type: Annual Visit.    Patient Requested Dates(s)Next Monday or first available   Patient Requested Time:Anytime for next Monday   Provider Name:Grantmaier, Jon HERO, APRN - CNP      Reason for Appointment Request: Other Patient have an appointment for March 19, 2024 and she want to reschedule the appt. For next Monday or as soon as possible   --------------------------------------------------------------------------------------------------------------------------    Relationship to Patient: Self     Call Back Information: OK to leave message on voicemail  Preferred Call Back Number: Phone [phone]  "

## 2024-03-09 NOTE — Telephone Encounter (Signed)
"  Future Appointments   Date Time Provider Department Center   05/11/2024  3:40 PM Grantmaier, Jon HERO, APRN - CNP [facility] DEP     Rescheduled w/pt  "

## 2024-03-19 ENCOUNTER — Encounter: Payer: BLUE CROSS/BLUE SHIELD | Attending: Acute Care | Primary: Acute Care

## 2024-04-11 ENCOUNTER — Encounter

## 2024-04-11 NOTE — Telephone Encounter (Signed)
"  LOV 02/09/2023    Future Appointments   Date Time Provider Department Center   05/11/2024  3:40 PM Grantmaier, Jon HERO, APRN - CNP [facility] DE[facility]       "

## 2024-04-13 MED ORDER — ESCITALOPRAM OXALATE 10 MG PO TABS
10 | ORAL_TABLET | Freq: Every day | ORAL | 1 refills | 90.00000 days | Status: AC
Start: 2024-04-13 — End: ?

## 2024-05-07 MED ORDER — VALACYCLOVIR HCL 1 G PO TABS
1 | ORAL_TABLET | ORAL | 2 refills | 30.00000 days | Status: AC
Start: 2024-05-07 — End: ?

## 2024-05-07 NOTE — Telephone Encounter (Signed)
 Future Appointments   Date Time Provider Department Center   05/11/2024  3:40 PM Jan Jon HERO, APRN - CNP [facility] DE[facility]     LOV 02/09/2023

## 2024-05-11 ENCOUNTER — Encounter: Payer: BLUE CROSS/BLUE SHIELD | Attending: Acute Care | Primary: Acute Care

## 2024-05-11 NOTE — Telephone Encounter (Signed)
"  Left message patient missed appointment  "

## 2024-05-11 NOTE — Progress Notes (Unsigned)
 05/11/2024    This is a 44 y.o. female No chief complaint on file.  SABRA    HPI    History of Present Illness       Patient Active Problem List   Diagnosis    Basal cell carcinoma of skin    Generalized anxiety disorder    Migraine    Seasonal allergic rhinitis    Uterine scar from previous cesarean delivery, antepartum    Acquired hammer toe of left foot    Acquired hammer toe of right foot    History of cesarean section       Current Outpatient Medications   Medication Sig Dispense Refill    valACYclovir  (VALTREX ) 1 g tablet TAKE 2 TABLETS BY MOUTH EVERY 12 HOURS FOR 1 DAYTAKE 2 TABLETS BY MOUTH EVERY 12 HOURS FOR 1 DAY 4 tablet 2    escitalopram  (LEXAPRO ) 10 MG tablet TAKE 1 TABLET BY MOUTH DAILY 90 tablet 1    naproxen  (NAPROSYN ) 500 MG tablet Take 1 tablet by mouth 2 times daily (with meals) 60 tablet 1    naproxen  (NAPROSYN ) 500 MG tablet TAKE 1 TABLET BY MOUTH DAILY AS NEEDED FOR MILD PAIN 30 tablet 5    Fexofenadine HCl (ALLEGRA ALLERGY PO) Take by mouth       No current facility-administered medications for this visit.       Allergies   Allergen Reactions    Latex Other (See Comments)     Band aides     Moxifloxacin Shortness Of Breath       There were no vitals taken for this visit.    Social History     Tobacco Use    Smoking status: Never    Smokeless tobacco: Never   Substance Use Topics    Alcohol use: Yes     Comment: occasional       Review of Systems    Physical Exam    Physical Exam      Results      Diagnosis    No diagnosis found.  Assessment & Plan      No orders of the defined types were placed in this encounter.      No orders of the defined types were placed in this encounter.      Patient Education:  ***    No follow-ups on file.    The patient (or guardian, if applicable) and other individuals in attendance with the patient were advised that Artificial Intelligence will be utilized during this visit to record, process the conversation to generate a clinical note, and support improvement of the  AI technology. The patient (or guardian, if applicable) and other individuals in attendance at the appointment consented to the use of AI, including the recording.
